# Patient Record
Sex: Female | Born: 1995 | Race: White | Hispanic: No | Marital: Single | Smoking: Never smoker
Health system: Southern US, Community
[De-identification: ages and names within clinical notes are randomized; demographics above are authoritative.]

## PROBLEM LIST (undated history)

## (undated) DIAGNOSIS — D649 Anemia, unspecified: Secondary | ICD-10-CM

## (undated) DIAGNOSIS — I498 Other specified cardiac arrhythmias: Secondary | ICD-10-CM

## (undated) DIAGNOSIS — F4310 Post-traumatic stress disorder, unspecified: Secondary | ICD-10-CM

## (undated) DIAGNOSIS — F419 Anxiety disorder, unspecified: Secondary | ICD-10-CM

## (undated) DIAGNOSIS — Z9889 Other specified postprocedural states: Secondary | ICD-10-CM

## (undated) DIAGNOSIS — K5090 Crohn's disease, unspecified, without complications: Secondary | ICD-10-CM

## (undated) DIAGNOSIS — Z9109 Other allergy status, other than to drugs and biological substances: Secondary | ICD-10-CM

## (undated) DIAGNOSIS — G90A Postural orthostatic tachycardia syndrome (POTS): Secondary | ICD-10-CM

## (undated) DIAGNOSIS — R000 Tachycardia, unspecified: Secondary | ICD-10-CM

## (undated) DIAGNOSIS — F329 Major depressive disorder, single episode, unspecified: Secondary | ICD-10-CM

## (undated) DIAGNOSIS — J45909 Unspecified asthma, uncomplicated: Secondary | ICD-10-CM

## (undated) DIAGNOSIS — N809 Endometriosis, unspecified: Secondary | ICD-10-CM

## (undated) DIAGNOSIS — I951 Orthostatic hypotension: Secondary | ICD-10-CM

## (undated) DIAGNOSIS — R197 Diarrhea, unspecified: Secondary | ICD-10-CM

## (undated) DIAGNOSIS — K219 Gastro-esophageal reflux disease without esophagitis: Secondary | ICD-10-CM

## (undated) DIAGNOSIS — R1110 Vomiting, unspecified: Secondary | ICD-10-CM

## (undated) DIAGNOSIS — J45990 Exercise induced bronchospasm: Secondary | ICD-10-CM

## (undated) DIAGNOSIS — R55 Syncope and collapse: Secondary | ICD-10-CM

## (undated) DIAGNOSIS — Q796 Ehlers-Danlos syndrome, unspecified: Secondary | ICD-10-CM

## (undated) DIAGNOSIS — G939 Disorder of brain, unspecified: Secondary | ICD-10-CM

## (undated) HISTORY — DX: Syncope and collapse: R55

## (undated) HISTORY — DX: Orthostatic hypotension: I95.1

## (undated) HISTORY — DX: Major depressive disorder, single episode, unspecified: F32.9

## (undated) HISTORY — DX: Unspecified asthma, uncomplicated: J45.909

## (undated) HISTORY — PX: INTRAUTERINE DEVICE INSERTION: SHX323

## (undated) HISTORY — DX: Post-traumatic stress disorder, unspecified: F43.10

## (undated) HISTORY — DX: Postural orthostatic tachycardia syndrome (POTS): G90.A

## (undated) HISTORY — DX: Other allergy status, other than to drugs and biological substances: Z91.09

## (undated) HISTORY — DX: Ehlers-Danlos syndrome, unspecified: Q79.60

## (undated) HISTORY — DX: Other specified postprocedural states: Z98.890

## (undated) HISTORY — DX: Other specified cardiac arrhythmias: I49.8

## (undated) HISTORY — DX: Vomiting, unspecified: R11.10

## (undated) HISTORY — DX: Tachycardia, unspecified: R00.0

## (undated) HISTORY — DX: Disorder of brain, unspecified: G93.9

## (undated) HISTORY — DX: Endometriosis, unspecified: N80.9

## (undated) HISTORY — DX: Exercise induced bronchospasm: J45.990

## (undated) HISTORY — PX: WISDOM TOOTH EXTRACTION: SHX21

## (undated) HISTORY — DX: Anemia, unspecified: D64.9

## (undated) HISTORY — DX: Diarrhea, unspecified: R19.7

## (undated) HISTORY — DX: Crohn's disease, unspecified, without complications: K50.90

---
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
## 2014-02-09 ENCOUNTER — Emergency Department: Payer: Self-pay | Admitting: Emergency Medicine

## 2014-02-09 LAB — URINALYSIS, COMPLETE
Bilirubin,UR: NEGATIVE
Blood: NEGATIVE
GLUCOSE, UR: NEGATIVE mg/dL (ref 0–75)
Ketone: NEGATIVE
LEUKOCYTE ESTERASE: NEGATIVE
Nitrite: NEGATIVE
PH: 7 (ref 4.5–8.0)
Protein: NEGATIVE
RBC,UR: NONE SEEN /HPF (ref 0–5)
Specific Gravity: 1.009 (ref 1.003–1.030)
WBC UR: 1 /HPF (ref 0–5)

## 2014-02-09 LAB — COMPREHENSIVE METABOLIC PANEL
ALBUMIN: 2.7 g/dL — AB (ref 3.8–5.6)
ALK PHOS: 92 U/L
Anion Gap: 5 — ABNORMAL LOW (ref 7–16)
BUN: 10 mg/dL (ref 9–21)
Bilirubin,Total: 0.2 mg/dL (ref 0.2–1.0)
CALCIUM: 8.4 mg/dL — AB (ref 9.0–10.7)
CHLORIDE: 109 mmol/L — AB (ref 97–107)
CO2: 24 mmol/L (ref 16–25)
Creatinine: 0.72 mg/dL (ref 0.60–1.30)
EGFR (African American): >60
Glucose: 77 mg/dL (ref 65–99)
Osmolality: 274 (ref 275–301)
POTASSIUM: 4.3 mmol/L (ref 3.3–4.7)
SGOT(AST): 20 U/L (ref 0–26)
SGPT (ALT): 15 U/L (ref 12–78)
Sodium: 138 mmol/L (ref 132–141)
Total Protein: 7.2 g/dL (ref 6.4–8.6)

## 2014-02-09 LAB — CBC
HCT: 35.8 % (ref 35.0–47.0)
HGB: 10.9 g/dL — AB (ref 12.0–16.0)
MCH: 23.1 pg — ABNORMAL LOW (ref 26.0–34.0)
MCHC: 30.4 g/dL — ABNORMAL LOW (ref 32.0–36.0)
MCV: 76 fL — ABNORMAL LOW (ref 80–100)
PLATELETS: 253 10*3/uL (ref 150–440)
RBC: 4.71 10*6/uL (ref 3.80–5.20)
RDW: 18.6 % — ABNORMAL HIGH (ref 11.5–14.5)
WBC: 8.3 10*3/uL (ref 3.6–11.0)

## 2014-02-25 ENCOUNTER — Emergency Department: Payer: Self-pay | Admitting: Emergency Medicine

## 2014-02-25 LAB — COMPREHENSIVE METABOLIC PANEL
AST: 19 U/L (ref 0–26)
Albumin: 2.6 g/dL — ABNORMAL LOW (ref 3.8–5.6)
Alkaline Phosphatase: 70 U/L
Anion Gap: 3 — ABNORMAL LOW (ref 7–16)
BUN: 11 mg/dL (ref 7–18)
Bilirubin,Total: 0.2 mg/dL (ref 0.2–1.0)
CALCIUM: 8.3 mg/dL — AB (ref 9.0–10.7)
CHLORIDE: 110 mmol/L — AB (ref 98–107)
Co2: 28 mmol/L (ref 21–32)
Creatinine: 0.8 mg/dL (ref 0.60–1.30)
EGFR (African American): >60
GLUCOSE: 90 mg/dL (ref 65–99)
Osmolality: 280 (ref 275–301)
Potassium: 3.5 mmol/L (ref 3.5–5.1)
SGPT (ALT): 12 U/L (ref 12–78)
Sodium: 141 mmol/L (ref 136–145)
TOTAL PROTEIN: 6.5 g/dL (ref 6.4–8.6)

## 2014-02-25 LAB — ACETAMINOPHEN LEVEL: ACETAMINOPHEN: 13 ug/mL

## 2014-02-25 LAB — CBC
HCT: 33.2 % — AB (ref 35.0–47.0)
HGB: 10.8 g/dL — AB (ref 12.0–16.0)
MCH: 24.6 pg — AB (ref 26.0–34.0)
MCHC: 32.4 g/dL (ref 32.0–36.0)
MCV: 76 fL — ABNORMAL LOW (ref 80–100)
Platelet: 208 10*3/uL (ref 150–440)
RBC: 4.37 10*6/uL (ref 3.80–5.20)
RDW: 18.2 % — ABNORMAL HIGH (ref 11.5–14.5)
WBC: 7 10*3/uL (ref 3.6–11.0)

## 2014-04-06 ENCOUNTER — Ambulatory Visit: Payer: Self-pay | Admitting: Urgent Care

## 2014-04-06 LAB — COMPREHENSIVE METABOLIC PANEL
ALBUMIN: 2.9 g/dL — AB (ref 3.8–5.6)
ALK PHOS: 90 U/L
ALT: 16 U/L (ref 12–78)
Anion Gap: 3 — ABNORMAL LOW (ref 7–16)
BUN: 8 mg/dL (ref 7–18)
Bilirubin,Total: 0.2 mg/dL (ref 0.2–1.0)
CO2: 30 mmol/L (ref 21–32)
Calcium, Total: 9 mg/dL (ref 9.0–10.7)
Chloride: 107 mmol/L (ref 98–107)
Creatinine: 0.65 mg/dL (ref 0.60–1.30)
EGFR (African American): >60
GLUCOSE: 90 mg/dL (ref 65–99)
Osmolality: 277 (ref 275–301)
POTASSIUM: 3.9 mmol/L (ref 3.5–5.1)
SGOT(AST): 22 U/L (ref 0–26)
SODIUM: 140 mmol/L (ref 136–145)
Total Protein: 7.6 g/dL (ref 6.4–8.6)

## 2014-04-06 LAB — URINALYSIS, COMPLETE
BILIRUBIN, UR: NEGATIVE
Bacteria: NONE SEEN
Blood: NEGATIVE
Glucose,UR: NEGATIVE mg/dL (ref 0–75)
Ketone: NEGATIVE
NITRITE: NEGATIVE
Ph: 5 (ref 4.5–8.0)
Protein: NEGATIVE
RBC, UR: NONE SEEN /HPF (ref 0–5)
SPECIFIC GRAVITY: 1.021 (ref 1.003–1.030)
WBC UR: 1 /HPF (ref 0–5)

## 2014-04-06 LAB — CBC WITH DIFFERENTIAL/PLATELET
BASOS ABS: 0 10*3/uL (ref 0.0–0.1)
Basophil %: 0.6 %
EOS ABS: 0.1 10*3/uL (ref 0.0–0.7)
Eosinophil %: 1.3 %
HCT: 38.8 % (ref 35.0–47.0)
HGB: 12.2 g/dL (ref 12.0–16.0)
LYMPHS ABS: 1.5 10*3/uL (ref 1.0–3.6)
Lymphocyte %: 19.8 %
MCH: 23.6 pg — ABNORMAL LOW (ref 26.0–34.0)
MCHC: 31.3 g/dL — ABNORMAL LOW (ref 32.0–36.0)
MCV: 75 fL — ABNORMAL LOW (ref 80–100)
Monocyte #: 0.7 x10 3/mm (ref 0.2–0.9)
Monocyte %: 9.3 %
NEUTROS PCT: 69 %
Neutrophil #: 5.3 10*3/uL (ref 1.4–6.5)
Platelet: 204 10*3/uL (ref 150–440)
RBC: 5.16 10*6/uL (ref 3.80–5.20)
RDW: 16.9 % — ABNORMAL HIGH (ref 11.5–14.5)
WBC: 7.7 10*3/uL (ref 3.6–11.0)

## 2014-04-06 LAB — PREGNANCY, URINE: PREGNANCY TEST, URINE: NEGATIVE m[IU]/mL

## 2014-04-06 LAB — SEDIMENTATION RATE: ERYTHROCYTE SED RATE: 19 mm/h (ref 0–20)

## 2014-04-15 ENCOUNTER — Other Ambulatory Visit: Payer: Self-pay | Admitting: Urgent Care

## 2014-04-15 LAB — CLOSTRIDIUM DIFFICILE(ARMC)

## 2014-04-17 DIAGNOSIS — Q828 Other specified congenital malformations of skin: Secondary | ICD-10-CM

## 2014-04-17 DIAGNOSIS — Q796 Ehlers-Danlos syndrome, unspecified: Secondary | ICD-10-CM | POA: Insufficient documentation

## 2014-04-17 HISTORY — DX: Other specified congenital malformations of skin: Q82.8

## 2014-04-17 LAB — STOOL CULTURE

## 2014-06-21 DIAGNOSIS — D899 Disorder involving the immune mechanism, unspecified: Secondary | ICD-10-CM | POA: Insufficient documentation

## 2014-09-05 DIAGNOSIS — K529 Noninfective gastroenteritis and colitis, unspecified: Secondary | ICD-10-CM | POA: Insufficient documentation

## 2015-01-29 ENCOUNTER — Ambulatory Visit: Payer: Self-pay | Admitting: Urgent Care

## 2015-05-19 ENCOUNTER — Telehealth: Payer: Self-pay

## 2015-05-19 NOTE — Telephone Encounter (Signed)
That is correct - thanks

## 2015-05-19 NOTE — Telephone Encounter (Signed)
Patient called asking for [place] to fax [phone] her a lab order to her doctors's office in [place] (Dr. Scherry Ran). She stated that she needed to have her Humira refilled.  I faxed a CMP and a CBC which is what you had ordered for her to do. Please let me know if you are okay with this or if you wanted to add something else. Tx./MB

## 2015-06-16 ENCOUNTER — Ambulatory Visit (INDEPENDENT_AMBULATORY_CARE_PROVIDER_SITE_OTHER): Payer: BLUE CROSS/BLUE SHIELD | Admitting: Urgent Care

## 2015-06-16 ENCOUNTER — Encounter: Payer: Self-pay | Admitting: Urgent Care

## 2015-06-16 VITALS — BP 105/68 | HR 78 | Temp 98.4°F | Ht 62.0 in | Wt 136.4 lb

## 2015-06-16 DIAGNOSIS — K5090 Crohn's disease, unspecified, without complications: Secondary | ICD-10-CM

## 2015-06-16 MED ORDER — HUMIRA PEN 40 MG/0.8ML ~~LOC~~ PNKT: 40.0000 mg | PEN_INJECTOR | SUBCUTANEOUS | Status: DC

## 2015-06-16 NOTE — Progress Notes (Signed)
   Primary Care Physician: Dr Andrey Spearman Primary Gastroenterologist:  Dr Servando Snare  Chief Complaint  Patient presents with  . Crohn's Disease    Follow-up    HPI: Penny Ho is a 19 y.o. female here for follow up of Crohn's disease.  She is doing very. Denies heartburn, indigestion, nausea, vomiting, dysphagia, odynophagia or anorexia. Denies constipation, diarrhea, rectal bleeding, melena or weight loss.  She is working in [employer] at [employer] over the summer.  She feels great like Humira has changed her life.  Current Outpatient Prescriptions  Medication Sig Dispense Refill  . dicyclomine (BENTYL) 10 MG capsule Take 10 mg by mouth 3 (three) times daily as needed.     Marland Kitchen HUMIRA PEN 40 MG/0.8ML PNKT Inject 40 mg as directed every 14 (fourteen) days. 2 each 5  . mesalamine (PENTASA) 250 MG CR capsule Take 250 mg by mouth 4 (four) times daily.    . midodrine (PROAMATINE) 10 MG tablet Take 10 mg by mouth 3 (three) times daily.    . promethazine (PHENERGAN) 12.5 MG tablet Take 12.5 mg by mouth every 6 (six) hours as needed for nausea or vomiting.    . sertraline (ZOLOFT) 100 MG tablet Take 100 mg by mouth daily.  5   No current facility-administered medications for this visit.    Allergies as of 06/16/2015  . (No Known Allergies)    Review of Systems: Gen: Denies any fever, chills, fatigue, weakness, malaise ENT: Negative for hoarseness, difficulty swallowing , nasal congestion CV: Denies chest pain, angina, palpitations, syncope, orthopnea, PND, peripheral edema, and claudication. Resp: Denies dyspnea at rest, dyspnea with exercise, cough, sputum, wheezing, coughing up blood, and pleurisy. GI: See HPI GU:  Negative for dysuria, hematuria, urinary incontinence, urinary frequency, nocturnal urination.  Endo: Negative for unusual weight change or sweats Derm: Denies jaundice, rash, itching, or unhealing ulcers.  Psych: Denies depression, anxiety, memory loss, suicidal ideation, hallucinations,  paranoia, and confusion. Heme: Denies bruising, bleeding, and enlarged lymph nodes.   Physical Examination:  BP 105/68 mmHg  Pulse 78  Temp(Src) 98.4 F (36.9 C) (Oral)  Ht 5\' 2"  (1.575 m)  Wt 136 lb 6.4 oz (61.871 kg)  BMI 24.94 kg/m2  LMP 08/15/2013 Body mass index is 24.94 kg/(m^2). Patient's last menstrual period was 08/15/2013. General:   Alert,  Well-developed, well-nourished, pleasant and cooperative in NAD Head:  Normocephalic and atraumatic. Eyes:  Sclera clear, no icterus.   Conjunctiva pink. Mouth:  No deformity or lesions.  Oropharynx pink & moist. Neck:  Supple; no masses or thyromegaly. Heart:  Regular rate and rhythm; no murmurs, clicks, rubs,  or gallops. Abdomen:   Normal bowel sounds.  Soft, nontender and nondistended. No masses, hepatosplenomegaly or hernias noted. No guarding or rebound tenderness.   Msk:  Symmetrical without Deane deformities. Normal posture. Pulses:  Normal pulses noted. Extremities:  Without clubbing or edema. Neurologic:  Alert and  oriented x3;  grossly normal neurologically. Skin:  Intact without significant lesions or rashes. Cervical Nodes:  No significant cervical adenopathy. Psych:  Alert and cooperative. Normal mood and affect.

## 2015-06-16 NOTE — Assessment & Plan Note (Signed)
Doing very well on Humira Continue Humira every 2 weeks Stop PENTASA Labs today Office visit in 3 months

## 2015-06-16 NOTE — Patient Instructions (Signed)
Continue Humira every 2 weeks Stop PENTASA Please get your labs as soon as possible.  We will call you with results.  Crohn Disease Crohn disease is a long-term (chronic) soreness and redness (inflammation) of the intestines (bowel). It can affect any portion of the digestive tract, from the mouth to the anus. It can also cause problems outside the digestive tract. Crohn disease is closely related to a disease called ulcerative colitis (together, these two diseases are called inflammatory bowel disease).  CAUSES  The cause of Crohn disease is not known. One Nelva Bush is that, in an easily affected person, the immune system is triggered to attack the body's own digestive tissue. Crohn disease runs in families. It seems to be more common in certain geographic areas and amongst certain races. There are no clear-cut dietary causes.  SYMPTOMS  Crohn disease can cause many different symptoms since it can affect many different parts of the body. Symptoms include:  Fatigue.  Weight loss.  Chronic diarrhea, sometime bloody.  Abdominal pain and cramps.  Fever.  Ulcers or canker sores in the mouth or rectum.  Anemia (low red blood cells).  Arthritis, skin problems, and eye problems may occur. Complications of Crohn disease can include:  Series of holes (perforation) of the bowel.  Portions of the intestines sticking to each other (adhesions).  Obstruction of the bowel.  Fistula formation, typically in the rectal area but also in other areas. A fistula is an opening between the bowels and the outside, or between the bowels and another organ.  A painful crack in the mucous membrane of the anus (rectal fissure). DIAGNOSIS  Your caregiver may suspect Crohn disease based on your symptoms and an exam. Blood tests may confirm that there is a problem. You may be asked to submit a stool specimen for examination. X-rays and CT scans may be necessary. Ultimately, the diagnosis is usually made after a  procedure that uses a flexible tube that is inserted via your mouth or your anus. This is done under sedation and is called either an upper endoscopy or colonoscopy. With these tests, the specialist can take tiny tissue samples and remove them from the inside of the bowel (biopsy). Examination of this biopsy tissue under a microscope can reveal Crohn disease as the cause of your symptoms. Due to the many different forms that Crohn disease can take, symptoms may be present for several years before a diagnosis is made. TREATMENT  Medications are often used to decrease inflammation and control the immune system. These include medicines related to aspirin, steroid medications, and newer and stronger medications to slow down the immune system. Some medications may be used as suppositories or enemas. A number of other medications are used or have been studied. Your caregiver will make specific recommendations. HOME CARE INSTRUCTIONS   Symptoms such as diarrhea can be controlled with medications. Avoid foods that have a laxative effect such as fresh fruit, vegetables, and dairy products. During flare-ups, you can rest your bowel by refraining from solid foods. Drink clear liquids frequently during the day. (Electrolyte or rehydrating fluids are best. Your caregiver can help you with suggestions.) Drink often to prevent loss of body fluids (dehydration). When diarrhea has cleared, eat small meals and more frequently. Avoid food additives and stimulants such as caffeine (coffee, tea, or chocolate). Enzyme supplements may help if you develop intolerance to a sugar in dairy products (lactose). Ask your caregiver or dietitian about specific dietary instructions.  Try to maintain a positive attitude.  Learn relaxation techniques such as self-hypnosis, mental imaging, and muscle relaxation.  If possible, avoid stresses which can aggravate your condition.  Exercise regularly.  Follow your diet.  Always get plenty of  rest. SEEK MEDICAL CARE IF:   Your symptoms fail to improve after a week or two of new treatment.  You experience continued weight loss.  You have ongoing cramps or loose bowels.  You develop a new skin rash, skin sores, or eye problems. SEEK IMMEDIATE MEDICAL CARE IF:   You have worsening of your symptoms or develop new symptoms.  You have a fever.  You develop bloody diarrhea.  You develop severe abdominal pain. MAKE SURE YOU:   Understand these instructions.  Will watch your condition.  Will get help right away if you are not doing well or get worse. Document Released: 09/13/2005 Document Revised: 04/20/2014 Document Reviewed: 08/12/2007 [facility] Patient Information 2015 [place], [place]. This information is not intended to replace advice given to you by your health care provider. Make sure you discuss any questions you have with your health care provider.

## 2015-06-17 ENCOUNTER — Encounter: Payer: Self-pay | Admitting: Urgent Care

## 2015-06-17 LAB — CBC WITH DIFFERENTIAL/PLATELET
Basophils Absolute: 0.1 10*3/uL (ref 0.0–0.2)
Basos: 1 %
EOS (ABSOLUTE): 0.2 10*3/uL (ref 0.0–0.4)
EOS: 2 %
HEMATOCRIT: 39.5 % (ref 34.0–46.6)
Hemoglobin: 12.3 g/dL (ref 11.1–15.9)
Immature Grans (Abs): 0 10*3/uL (ref 0.0–0.1)
Immature Granulocytes: 0 %
LYMPHS: 33 %
Lymphocytes Absolute: 2.4 10*3/uL (ref 0.7–3.1)
MCH: 25 pg — ABNORMAL LOW (ref 26.6–33.0)
MCHC: 31.1 g/dL — ABNORMAL LOW (ref 31.5–35.7)
MCV: 80 fL (ref 79–97)
MONOCYTES: 9 %
MONOS ABS: 0.6 10*3/uL (ref 0.1–0.9)
Neutrophils Absolute: 4 10*3/uL (ref 1.4–7.0)
Neutrophils: 55 %
Platelets: 246 10*3/uL (ref 150–379)
RBC: 4.92 x10E6/uL (ref 3.77–5.28)
RDW: 17.5 % — ABNORMAL HIGH (ref 12.3–15.4)
WBC: 7.3 10*3/uL (ref 3.4–10.8)

## 2015-06-17 LAB — COMPREHENSIVE METABOLIC PANEL
ALT: 15 IU/L (ref 0–32)
AST: 22 IU/L (ref 0–40)
Albumin/Globulin Ratio: 1.6 (ref 1.1–2.5)
Albumin: 3.8 g/dL (ref 3.5–5.5)
Alkaline Phosphatase: 87 IU/L (ref 39–117)
BUN / CREAT RATIO: 22 — AB (ref 8–20)
BUN: 11 mg/dL (ref 6–20)
CALCIUM: 9.4 mg/dL (ref 8.7–10.2)
CO2: 27 mmol/L (ref 18–29)
CREATININE: 0.51 mg/dL — AB (ref 0.57–1.00)
Chloride: 100 mmol/L (ref 97–108)
GFR calc Af Amer: 160 mL/min/{1.73_m2} (ref >59)
GFR calc non Af Amer: 139 mL/min/{1.73_m2} (ref >59)
Globulin, Total: 2.4 g/dL (ref 1.5–4.5)
Glucose: 76 mg/dL (ref 65–99)
Potassium: 4.5 mmol/L (ref 3.5–5.2)
SODIUM: 139 mmol/L (ref 134–144)
TOTAL PROTEIN: 6.2 g/dL (ref 6.0–8.5)

## 2015-06-17 LAB — C-REACTIVE PROTEIN: CRP: 0.8 mg/L (ref 0.0–4.9)

## 2015-09-09 ENCOUNTER — Other Ambulatory Visit: Payer: Self-pay | Admitting: Family Medicine

## 2015-09-09 DIAGNOSIS — Z975 Presence of (intrauterine) contraceptive device: Secondary | ICD-10-CM

## 2015-09-14 ENCOUNTER — Telehealth: Payer: Self-pay

## 2015-09-14 NOTE — Telephone Encounter (Signed)
Patient left message stating that she is has Crohn's Disease and on Humira and having a relapse of symptoms. Would like to be seen as soon as possible by a provider. Was a previous patient of Lorenza Burton, NP. Last seen in office 06/16/15.

## 2015-09-15 ENCOUNTER — Ambulatory Visit
Admission: RE | Admit: 2015-09-15 | Discharge: 2015-09-15 | Disposition: A | Discharge status: Home / Self Care | Payer: BLUE CROSS/BLUE SHIELD | Source: Ambulatory Visit | Attending: Family Medicine | Admitting: Family Medicine

## 2015-09-15 DIAGNOSIS — Z975 Presence of (intrauterine) contraceptive device: Secondary | ICD-10-CM | POA: Insufficient documentation

## 2015-09-16 NOTE — Telephone Encounter (Signed)
LVM for pt to return my call to schedule a follow up appt.  

## 2015-11-29 ENCOUNTER — Telehealth: Payer: Self-pay | Admitting: Gastroenterology

## 2015-11-29 NOTE — Telephone Encounter (Signed)
Pt has been scheduled in [place] on Wed, 12/01/15.

## 2015-11-29 NOTE — Telephone Encounter (Signed)
Penny Ho, patient needs an appointment with Dr Servando Snare as soon as possible. She is an established patient with Dr Rayford Halsted. 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 Her Crohn's Disease has flared up very bad and she needs an appointment with Dr Servando Snare. Thanks

## 2015-12-01 ENCOUNTER — Encounter: Payer: Self-pay | Admitting: Gastroenterology

## 2015-12-01 ENCOUNTER — Other Ambulatory Visit: Payer: Self-pay

## 2015-12-01 ENCOUNTER — Ambulatory Visit (INDEPENDENT_AMBULATORY_CARE_PROVIDER_SITE_OTHER): Payer: BLUE CROSS/BLUE SHIELD | Admitting: Gastroenterology

## 2015-12-01 VITALS — BP 117/67 | HR 85 | Temp 97.7°F | Ht 62.0 in | Wt 135.0 lb

## 2015-12-01 DIAGNOSIS — D649 Anemia, unspecified: Secondary | ICD-10-CM

## 2015-12-01 DIAGNOSIS — K50919 Crohn's disease, unspecified, with unspecified complications: Secondary | ICD-10-CM

## 2015-12-01 DIAGNOSIS — J45909 Unspecified asthma, uncomplicated: Secondary | ICD-10-CM

## 2015-12-01 DIAGNOSIS — R1110 Vomiting, unspecified: Secondary | ICD-10-CM | POA: Insufficient documentation

## 2015-12-01 DIAGNOSIS — R197 Diarrhea, unspecified: Secondary | ICD-10-CM

## 2015-12-01 DIAGNOSIS — K5090 Crohn's disease, unspecified, without complications: Secondary | ICD-10-CM | POA: Insufficient documentation

## 2015-12-01 HISTORY — DX: Anemia, unspecified: D64.9

## 2015-12-01 HISTORY — DX: Unspecified asthma, uncomplicated: J45.909

## 2015-12-01 NOTE — Progress Notes (Signed)
Primary Care Physician: Noralee Stain, MD  Primary Gastroenterologist:  Dr. Midge Minium  No chief complaint on file.   HPI: Penny Ho is a 19 y.o. female here  With a history of Crohn's disease and a recent flare. The patient states she had eaten some cookies though and reports that she has had problems in the past when she has eating cookie dough. She believes that this may have set off her [place] recent flare of Crohn's. The patient had a C-reactive protein sent off and it was 13. The patient reports that she has abdominal cramps and diarrhea at the present time. The patient has been on Humira without any incident. I was contacted by her primary care provider about this a week ago and now the patient is here for follow-up of Crohn's.  Current Outpatient Prescriptions  Medication Sig Dispense Refill  . dicyclomine (BENTYL) 10 MG capsule Take 10 mg by mouth 3 (three) times daily as needed.     Marland Kitchen HUMIRA PEN 40 MG/0.8ML PNKT Inject 40 mg as directed every 14 (fourteen) days. 2 each 5  . methylphenidate 27 MG PO CR tablet once daily as needed.    . midodrine (PROAMATINE) 10 MG tablet Take 10 mg by mouth 3 (three) times daily.    . promethazine (PHENERGAN) 12.5 MG tablet Take 12.5 mg by mouth every 6 (six) hours as needed for nausea or vomiting.    . sertraline (ZOLOFT) 100 MG tablet Take 100 mg by mouth daily.  5  . Budesonide (UCERIS) 9 MG TB24 Reported on 12/01/2015    . mesalamine (PENTASA) 250 MG CR capsule Take 250 mg by mouth 4 (four) times daily. Reported on 12/01/2015    . propranolol (INDERAL) 10 MG tablet Take by mouth. Reported on 12/01/2015     No current facility-administered medications for this visit.    Allergies as of 12/01/2015 - Review Complete 06/16/2015  Allergen Reaction Noted  . Other Anaphylaxis 12/01/2015    ROS:  General: Negative for anorexia, weight loss, fever, chills, fatigue, weakness. ENT: Negative for hoarseness, difficulty swallowing , nasal  congestion. CV: Negative for chest pain, angina, palpitations, dyspnea on exertion, peripheral edema.  Respiratory: Negative for dyspnea at rest, dyspnea on exertion, cough, sputum, wheezing.  GI: See history of present illness. GU:  Negative for dysuria, hematuria, urinary incontinence, urinary frequency, nocturnal urination.  Endo: Negative for unusual weight change.    Physical Examination:   BP 117/67 mmHg  Pulse 85  Temp(Src) 97.7 F (36.5 C)  Ht 5\' 2"  (1.575 m)  Wt 135 lb (61.236 kg)  BMI 24.69 kg/m2  General: Well-nourished, well-developed in no acute distress.  Eyes: No icterus. Conjunctivae pink. Mouth: Oropharyngeal mucosa moist and pink , no lesions erythema or exudate. Lungs: Clear to auscultation bilaterally. Non-labored. Heart: Regular rate and rhythm, no murmurs rubs or gallops.  Abdomen: Bowel sounds are normal, nontender, nondistended, no hepatosplenomegaly or masses, no abdominal bruits or hernia , no rebound or guarding.   Extremities: No lower extremity edema. No clubbing or deformities. Neuro: Alert and oriented x 3.  Grossly intact. Skin: Warm and dry, no jaundice.   Psych: Alert and cooperative, normal mood and affect.  Labs:    Imaging Studies: No results found.  Assessment and Plan:   Penny Ho is a 18 y.o. y/o female  With a history of Crohn's disease who has been on Humira area the patient is now having a flare of her Crohn's disease with her CRP being  13. The patient has been told that she needs to be started on a rapid taper of steroids for 1 month while we check her Humira antibody levels. If the antibody levels are high then the patient may need to be started on a different biologic. Patient has been explained the plan and agrees with it.   Note: This dictation was prepared with Dragon dictation along with smaller phrase technology. Any transcriptional errors that result from this process are unintentional.

## 2015-12-09 LAB — ADALIMUMAB+AB (SERIAL MONITOR)
ANTI-ADALIMUMAB ANTIBODY: 93 ng/mL
Adalimumab Drug Level: 2.1 ug/mL

## 2015-12-14 ENCOUNTER — Telehealth: Payer: Self-pay | Admitting: Gastroenterology

## 2015-12-14 NOTE — Telephone Encounter (Signed)
Please call patient to talk about some side affects she is having since on steroids.

## 2015-12-15 ENCOUNTER — Telehealth: Payer: Self-pay

## 2015-12-15 NOTE — Telephone Encounter (Signed)
-----   Message from Darren Wohl, MD sent at 12/15/2015  8:59 AM EST ----- Patient know that the blood test did not suggest that she is resistant to her medication. She should continue taking it. 

## 2015-12-15 NOTE — Telephone Encounter (Signed)
Called pt and she stated the prednisone is making her depression really bad. She doesn't feel she can continue with 4 more weeks of this medication. Per Dr. Servando Snare to safely come off the prednisone pt is to do Prednisone 20mg  for 3 days and then 10mg  x 1 week. Pt understood instructions and will let me know if there is a problem.

## 2015-12-15 NOTE — Telephone Encounter (Signed)
-----   Message from Midge Minium, MD sent at 12/15/2015  8:59 AM EST ----- Patient know that the blood test did not suggest that she is resistant to her medication. She should continue taking it.

## 2015-12-15 NOTE — Telephone Encounter (Signed)
ERROR

## 2015-12-15 NOTE — Telephone Encounter (Signed)
Pt notified of results

## 2015-12-15 NOTE — Telephone Encounter (Signed)
LVM for pt to return my call.

## 2016-01-05 ENCOUNTER — Other Ambulatory Visit: Payer: Self-pay

## 2016-01-05 DIAGNOSIS — K50919 Crohn's disease, unspecified, with unspecified complications: Secondary | ICD-10-CM

## 2016-01-05 MED ORDER — HUMIRA PEN 40 MG/0.8ML ~~LOC~~ PNKT: 40.0000 mg | PEN_INJECTOR | SUBCUTANEOUS | Status: DC

## 2016-06-21 ENCOUNTER — Other Ambulatory Visit: Payer: Self-pay

## 2016-06-21 DIAGNOSIS — K50919 Crohn's disease, unspecified, with unspecified complications: Secondary | ICD-10-CM

## 2016-06-21 MED ORDER — HUMIRA PEN 40 MG/0.8ML ~~LOC~~ PNKT: 40.0000 mg | PEN_INJECTOR | SUBCUTANEOUS | Status: DC

## 2016-09-18 ENCOUNTER — Ambulatory Visit (INDEPENDENT_AMBULATORY_CARE_PROVIDER_SITE_OTHER): Payer: PRIVATE HEALTH INSURANCE | Admitting: Gastroenterology

## 2016-09-18 ENCOUNTER — Encounter: Payer: Self-pay | Admitting: Gastroenterology

## 2016-09-18 VITALS — BP 93/55 | HR 100 | Temp 98.7°F | Ht 62.0 in | Wt 134.0 lb

## 2016-09-18 DIAGNOSIS — K6289 Other specified diseases of anus and rectum: Secondary | ICD-10-CM

## 2016-09-18 NOTE — Progress Notes (Signed)
   Primary Care Physician: Noralee Stain, MD  Primary Gastroenterologist:  Dr. Midge Minium  Chief Complaint  Patient presents with  . Crohn's Disease  . Hemorrhoids    HPI: Penny Ho is a 20 y.o. female here for rectal pain. The patient was given a cream for her rectum last week and states it is not helping her.  Current Outpatient Prescriptions  Medication Sig Dispense Refill  . HUMIRA PEN 40 MG/0.8ML PNKT Inject 40 mg as directed every 14 (fourteen) days. 2 each 11  . sertraline (ZOLOFT) 100 MG tablet Take 100 mg by mouth daily.  5  . topiramate (TOPAMAX) 100 MG tablet Take 100 mg by mouth daily.    . Budesonide (UCERIS) 9 MG TB24 Reported on 12/01/2015    . dicyclomine (BENTYL) 10 MG capsule Take 10 mg by mouth 3 (three) times daily as needed.     . mesalamine (PENTASA) 250 MG CR capsule Take 250 mg by mouth 4 (four) times daily. Reported on 12/01/2015    . methylphenidate 27 MG PO CR tablet once daily as needed.    . midodrine (PROAMATINE) 10 MG tablet Take 10 mg by mouth 3 (three) times daily.    . promethazine (PHENERGAN) 12.5 MG tablet Take 12.5 mg by mouth every 6 (six) hours as needed for nausea or vomiting.    . propranolol (INDERAL) 10 MG tablet Take by mouth. Reported on 12/01/2015     No current facility-administered medications for this visit.     Allergies as of 09/18/2016 - Review Complete 06/16/2015  Allergen Reaction Noted  . Other Anaphylaxis 12/01/2015    ROS:  General: Negative for anorexia, weight loss, fever, chills, fatigue, weakness. ENT: Negative for hoarseness, difficulty swallowing , nasal congestion. CV: Negative for chest pain, angina, palpitations, dyspnea on exertion, peripheral edema.  Respiratory: Negative for dyspnea at rest, dyspnea on exertion, cough, sputum, wheezing.  GI: See history of present illness. GU:  Negative for dysuria, hematuria, urinary incontinence, urinary frequency, nocturnal urination.  Endo: Negative for  unusual weight change.    Physical Examination:   BP (!) 93/55   Pulse 100   Temp 98.7 F (37.1 C) (Oral)   Ht 5\' 2"  (1.575 m)   Wt 134 lb (60.8 kg)   BMI 24.51 kg/m   General: Well-nourished, well-developed in no acute distress.  Eyes: No icterus. Conjunctivae pink. Neuro: Alert and oriented x 3.  Grossly intact. Skin: Warm and dry, no jaundice.   Psych: Alert and cooperative, normal mood and affect. Rectal: Tissue breakdown around the anus Labs:    Imaging Studies: No results found.  Assessment and Plan:   Penny Ho is a 20 y.o. y/o female who comes in today with a history of Crohn's disease who is now having anal pain. The patient had a rectal examination showed tissue breakdown around the anus likely from constant moisture and stool to the area. The patient has been told to clean her self with nonalcoholic baby wipes. She has also been told to use acid desitin cream to the area and keep it clean and dry. She will call us in approximate one week if her symptoms do not improve. This is unlikely related to her Crohn's disease.   Note: This dictation was prepared with Dragon dictation along with smaller phrase technology. Any transcriptional errors that result from this process are unintentional.

## 2016-09-19 ENCOUNTER — Telehealth: Payer: Self-pay | Admitting: Surgery

## 2016-09-19 NOTE — Telephone Encounter (Signed)
Patient left a voice message that she forgot her school note for her appointment on 10/2 with Dr. Excell Seltzer. She will need it by Wednesday. Can you e-mail it to her? If not, please call her.

## 2016-09-19 NOTE — Telephone Encounter (Signed)
Faxed pt's school note to [facility] per her request. Sent to the attn: of Dr. Mikael Spray @ [phone].

## 2016-09-19 NOTE — Telephone Encounter (Signed)
Tried contacting pt again this afternoon. Mailbox is full. Not able to leave a message.

## 2016-09-19 NOTE — Telephone Encounter (Signed)
Tried contacting pt but vm was full. Couldn't leave message.

## 2016-09-27 ENCOUNTER — Encounter: Payer: BLUE CROSS/BLUE SHIELD | Admitting: Obstetrics and Gynecology

## 2016-10-04 ENCOUNTER — Encounter: Payer: Self-pay | Admitting: Obstetrics and Gynecology

## 2016-10-04 ENCOUNTER — Ambulatory Visit (INDEPENDENT_AMBULATORY_CARE_PROVIDER_SITE_OTHER): Payer: PRIVATE HEALTH INSURANCE | Admitting: Obstetrics and Gynecology

## 2016-10-04 VITALS — BP 99/63 | HR 79 | Ht 62.0 in | Wt 134.2 lb

## 2016-10-04 DIAGNOSIS — A630 Anogenital (venereal) warts: Secondary | ICD-10-CM | POA: Diagnosis not present

## 2016-10-04 DIAGNOSIS — K50919 Crohn's disease, unspecified, with unspecified complications: Secondary | ICD-10-CM

## 2016-10-04 DIAGNOSIS — N762 Acute vulvitis: Secondary | ICD-10-CM | POA: Diagnosis not present

## 2016-10-04 MED ORDER — NYSTATIN-TRIAMCINOLONE 100000-0.1 UNIT/GM-% EX OINT: 1.0000 "application " | TOPICAL_OINTMENT | Freq: Two times a day (BID) | CUTANEOUS | 0 refills | Status: AC

## 2016-10-04 NOTE — Progress Notes (Signed)
GYN ENCOUNTER NOTE  Subjective:       Penny Ho is a 20 y.o. G0P0000 female is here for gynecologic evaluation of the following issues:  1. Vulvitis- Pt noticed itching in the vaginal area 2 days ago, after applying Desitin cream for anal irritation vulva became red, inflamed and extremely itchy. Symptoms have subsided but not resolved. Pt [facility] where they tested for yeast and BV. Results inconclusive, empirically treated with Metronidazole insertion, pt has completed one day of treatment.  Sexually active with one partner for the past 1.5 years and 3 total partners in the past 2 years. Never tested for STDs and is interested in being tested today.  Recent use of antibiotics in the past 2 weeks.   Gynecologic History No LMP recorded. Patient is not currently having periods (Reason: IUD). Contraception: IUD Last Pap: Never Last mammogram: N/A  Obstetric History OB History  Gravida Para Term Preterm AB Living  0 0 0 0 0 0  SAB TAB Ectopic Multiple Live Births  0 0 0 0 0        Past Medical History:  Diagnosis Date  . Anemia   . Asthma   . Crohn's disease (HCC)   . Vaso vagal episode    P[facility]S-[facility]  . Vomiting and diarrhea     Past Surgical History:  Procedure Laterality Date  . INTRAUTERINE DEVICE INSERTION      Current Outpatient Prescriptions on File Prior to Visit  Medication Sig Dispense Refill  . HUMIRA PEN 40 MG/0.8ML PNKT Inject 40 mg as directed every 14 (fourteen) days. 2 each 11  . sertraline (ZOLOFT) 100 MG tablet Take 100 mg by mouth daily.  5  . topiramate (TOPAMAX) 100 MG tablet Take 100 mg by mouth daily.     No current facility-administered medications on file prior to visit.     Allergies  Allergen Reactions  . Other Anaphylaxis    Tree Nuts- anaphylaxis    Social History   Social History  . Marital status: Single    Spouse name: N/A  . Number of children: N/A  . Years of education: N/A    Occupational History  . Not on file.   Social History Main Topics  . Smoking status: Never Smoker  . Smokeless tobacco: Never Used  . Alcohol use 0.0 oz/week     Comment: occasional  . Drug use: No     Comment: Marijuana occ-last June  . Sexual activity: Not Currently    Partners: Male    Birth control/ protection: IUD   Other Topics Concern  . Not on file   Social History Narrative  . No narrative on file    Family History  Problem Relation Age of Onset  . Cancer Paternal Grandmother   . Cancer Paternal Grandfather   . Colon cancer Neg Hx   . Crohn's disease Neg Hx     The following portions of the patient's history were reviewed and updated as appropriate: allergies, current medications, past family history, past medical history, past social history, past surgical history and problem list.  Review of Systems Review of Systems - General ROS: negative for - chills, fatigue, fever, hot flashes, malaise or night sweats Hematological and Lymphatic ROS: negative for - bleeding problems or swollen lymph nodes Gastrointestinal ROS: negative for - abdominal pain, blood in stools, change in bowel habits and nausea/vomiting Musculoskeletal ROS: negative for - joint pain, muscle pain or muscular weakness  Genito-Urinary ROS: negative for - change in menstrual cycle, dysmenorrhea, dyspareunia, dysuria, genital discharge, genital ulcers, hematuria, incontinence, irregular/heavy menses, nocturia or pelvic pain  Objective:   BP 99/63   Pulse 79   Ht 5\' 2"  (1.575 m)   Wt 134 lb 3.2 oz (60.9 kg)   BMI 24.55 kg/m  CONSTITUTIONAL: Well-developed, well-nourished female in no acute distress.  HENT:  Normocephalic, atraumatic.  NECK: Normal range of motion, supple, no masses.  Normal thyroid.  SKIN: Skin is warm and dry. No rash noted. Not diaphoretic. No erythema. No pallor. NEUROLGIC: Alert and oriented to person, place, and time. PSYCHIATRIC: Normal mood and affect. Normal  behavior. Normal judgment and thought content. CARDIOVASCULAR:Not Examined RESPIRATORY: Not Examined BREASTS: Not Examined ABDOMEN: Soft, non distended; Non tender.  No Organomegaly. PELVIC:  External Genitalia: Moderate to severe vulvar hyperemia located in the perianal posterior fourchette region; possible flat wart at 10:00 in relation to anus  BUS: Normal  Vagina: Normal; white secretions in vault (patient used [employer] yesterday)  Cervix: Normal; no Kangas lesions; IUD string not visualized  Uterus: Normal size, shape,consistency, mobile, nontender  Adnexa: Normal  RV: Normal   Bladder: Nontender MUSCULOSKELETAL: Normal range of motion. No tenderness.  No cyanosis, clubbing, or edema.  PROCEDURE: Wet prep Normal saline-negative KOH-negative     Assessment:   1. Acute vulvitis - NuSwab Vaginitis Plus (VG+)  2. Desires STI testing     Plan:   1. Wet prep as noted 2. Nu swab plus 3. STI testing 4. Nystatin/triamcinolone cream topically twice a day for 14 days 5. Return in 2 weeks for follow-up, Skyla IUD replacement  Corena Herter, PA-S Herold Harms, MD   I have seen, interviewed, and examined the patient in conjunction with the [facility] student and affirm the diagnosis and management plan. Penny Kettles A. Wilbur Oakland, MD, FACOG   Note: This dictation was prepared with Dragon dictation along with smaller phrase technology. Any transcriptional errors that result from this process are unintentional.

## 2016-10-04 NOTE — Patient Instructions (Addendum)
1. STI testing is done today 2. Nu swab is done to assess vaginitis etiology 3. Nystatin triamcinolone cream is to be applied topically to the vulva twice a day for 14 days 4. Return in 2 weeks for follow-up and SkylaIUD insertion

## 2016-10-06 LAB — HIV ANTIBODY (ROUTINE TESTING W REFLEX): HIV SCREEN 4TH GENERATION: NONREACTIVE

## 2016-10-06 LAB — HSV(HERPES SIMPLEX VRS) I + II AB-IGG: HSV 2 Glycoprotein G Ab, IgG: <0.91 index (ref 0.00–0.90)

## 2016-10-06 LAB — HEPATITIS B SURFACE ANTIGEN: HEP B S AG: NEGATIVE

## 2016-10-06 LAB — RPR: RPR: NONREACTIVE

## 2016-10-06 LAB — HEPATITIS C ANTIBODY: Hep C Virus Ab: <0.1 s/co ratio (ref 0.0–0.9)

## 2016-10-09 LAB — NUSWAB VAGINITIS PLUS (VG+)
ATOPOBIUM VAGINAE: HIGH {score} — AB
BVAB 2: HIGH Score — AB
CANDIDA ALBICANS, NAA: NEGATIVE
CANDIDA GLABRATA, NAA: NEGATIVE
CHLAMYDIA TRACHOMATIS, NAA: NEGATIVE
Megasphaera 1: HIGH Score — AB
NEISSERIA GONORRHOEAE, NAA: NEGATIVE
TRICH VAG BY NAA: NEGATIVE

## 2016-10-11 ENCOUNTER — Telehealth: Payer: Self-pay

## 2016-10-11 MED ORDER — METRONIDAZOLE 0.75 % VA GEL: 1.0000 | Freq: Every day | VAGINAL | 0 refills | Status: DC

## 2016-10-11 MED ORDER — METRONIDAZOLE 0.75 % VA GEL: 1.0000 | Freq: Two times a day (BID) | VAGINAL | 0 refills | Status: DC

## 2016-10-11 NOTE — Telephone Encounter (Signed)
-----   Message from Herold Harms, MD sent at 10/06/2016  2:36 PM EDT ----- Please Notify - Labs normal

## 2016-10-11 NOTE — Telephone Encounter (Signed)
Pt aware results. Still having sx. Per mad ok to refill metrogel.

## 2016-10-24 ENCOUNTER — Telehealth: Payer: Self-pay

## 2016-10-24 NOTE — Telephone Encounter (Signed)
Patient called in stating that she is having a "bad crohn's flare." The last dose of Humira taken was on 10/20/16 per patient and this is when she began experiencing Nausea/Vomiting, Abdominal Pain, and Diarrhea.  Spoke with Ginger in regards to this patient. Patient placed on schedule for tomorrow to see Dr. Tobi Bastos for active flare.

## 2016-10-25 ENCOUNTER — Ambulatory Visit (INDEPENDENT_AMBULATORY_CARE_PROVIDER_SITE_OTHER): Payer: PRIVATE HEALTH INSURANCE | Admitting: Gastroenterology

## 2016-10-25 ENCOUNTER — Other Ambulatory Visit
Admission: RE | Admit: 2016-10-25 | Discharge: 2016-10-25 | Disposition: A | Discharge status: Home / Self Care | Payer: 59 | Source: Ambulatory Visit | Attending: Gastroenterology | Admitting: Gastroenterology

## 2016-10-25 ENCOUNTER — Encounter: Payer: Self-pay | Admitting: Gastroenterology

## 2016-10-25 ENCOUNTER — Other Ambulatory Visit: Payer: Self-pay

## 2016-10-25 VITALS — BP 115/67 | HR 106 | Temp 97.7°F | Ht 62.0 in | Wt 136.0 lb

## 2016-10-25 DIAGNOSIS — K5090 Crohn's disease, unspecified, without complications: Secondary | ICD-10-CM | POA: Insufficient documentation

## 2016-10-25 DIAGNOSIS — K50919 Crohn's disease, unspecified, with unspecified complications: Secondary | ICD-10-CM

## 2016-10-25 DIAGNOSIS — K5000 Crohn's disease of small intestine without complications: Secondary | ICD-10-CM

## 2016-10-25 NOTE — Progress Notes (Signed)
Gastroenterology Consultation  Referring Provider:     Noralee Stain, MD Primary Care Physician:  Noralee Stain, MD Primary Gastroenterologist:  Dr. Servando Snare        HPI:   Penny Ho is a 20 y.o. y/o female .  Penny Ho is a patient of Dr Servando Snare whom he has been treating for Crohns disease.Ct abdomen in 2015 demonstrated thickening of the small bowel in the distal and terminal ileum . She was last seen by Dr Servando Snare on 09/18/16 for rectal pain    Diagnosis and date:  Dec 2014, diagnosed here , unsure area affected.  Prior surgeries: None  Present medication:  Humira, every other week since 02/2014 , last flare she states presently , prior to this says been getting worse over the past few months. She had adequate drug levels in 2015 and low antibody titre. Never been on Imuran or methotrexate  Last dose of Humira was 7 days back   Prior biologic use : as above  Last MRE:   > 1 year back none on our system  Last EGD/Colonoscopy: Last colonoscopy 11/2013- ?ileal disease and sigmoid disease- commenced on pentasa and entocort Smoking status: never  Use of NSAID's :  Alleve-as needed , using every other day last 3-4 days  Last use of steroids: 11/2015 -prednisone for 2 weeks, symptoms did not really get better Chronic steroid use : no  Last DEXA scan : no  Hep B/A: immunization status:  Unknown but has had immunization  Last Tdap, pneumonia and flu shot: Unsure about tdap but thinks she has had recently .  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  Site :rt lower abdomen and epigastrium  Radiation: over her abdomen  Severity :moderate    Nature of pain: crampy Aggravating factors: eating  Relieving factors : waiting  Weight loss: no  NSAID use: yes  PPI use :none  Gall bladder surgery: yes  Frequency of bowel movements: once every few hours, atleast 20 per day , looks like grains of rice , blood seen .  Change in bowel movements: yes-since 10/2015 when she had salmonella  Relief with bowel movements: no  Gas/Bloating/Abdominal distension: yes, has abdominal distension , when she passes gas is foul smelling , consumes a bit, no diet sodas, no gum, no crystalite, does consume high fructose corn syrup.    Past Medical History:  Diagnosis Date  . Anemia   . Asthma   . Crohn's disease (HCC)   . Vaso vagal episode    POTS-followed by [facility]  . Vomiting and diarrhea     Past Surgical History:  Procedure Laterality Date  . INTRAUTERINE DEVICE INSERTION      Prior to Admission medications   Medication Sig Start Date End Date Taking? Authorizing Provider  CIPRODEX otic suspension APPLY FOUR DROPS TO AFFECTED EAR TWICE DAILY FOR 7 DAYS 10/07/16  Yes Historical Provider, MD  gentamicin ointment (GARAMYCIN) 0.1 % APPLY TO PAINFUL EAR THREE TIMES DAILY 10/06/16  Yes Historical Provider, MD  HUMIRA PEN 40 MG/0.8ML PNKT Inject 40 mg as directed every  14 (fourteen) days. 06/21/16  Yes Darren Servando SnareWohl, MD  hydrocortisone-pramoxine [facility](ANALPRAM-HC) 2.5-1 % rectal cream APPLY A SMALL AMOUNT TO AFFECTED AREA TWICE A DAY 09/04/16  Yes Historical Provider, MD  olopatadine (PATANOL) 0.1 % ophthalmic solution APPLY 1 DROP INTO BOTH EYES TWICE A DAY 09/21/16  Yes Historical Provider, MD  sertraline (ZOLOFT) 100 MG tablet Take 100 mg by mouth daily. 05/27/15  Yes Historical Provider, MD  topiramate (TOPAMAX) 50 MG tablet 1 TABLET BY MOUTH TWICE A DAY 09/02/16  Yes Historical Provider, MD    Family History  Problem Relation Age of Onset  . Cancer Paternal Grandmother   . Cancer Paternal Grandfather   . Colon cancer Neg Hx   . Crohn's  disease Neg Hx      Social History  Substance Use Topics  . Smoking status: Never Smoker  . Smokeless tobacco: Never Used  . Alcohol use 0.0 oz/week     Comment: occasional    Allergies as of 10/25/2016 - Review Complete 10/25/2016  Allergen Reaction Noted  . Other Anaphylaxis 12/01/2015    Review of Systems:    All systems reviewed and negative except where noted in HPI.   Physical Exam:  BP 115/67   Pulse (!) 106   Temp 97.7 F (36.5 C) (Oral)   Ht 5\' 2"  (1.575 m)   Wt 136 lb (61.7 kg)   BMI 24.87 kg/m  No LMP recorded. Patient is not currently having periods (Reason: IUD). Psych:  Alert and cooperative. Normal mood and affect. General:   Alert,  Well-developed, well-nourished, pleasant and cooperative in NAD Head:  Normocephalic and atraumatic. Eyes:  Sclera clear, no icterus.   Conjunctiva pink. Ears:  Normal auditory acuity. Nose:  No deformity, discharge, or lesions. Mouth:  No deformity or lesions,oropharynx pink & moist. Neck:  Supple; no masses or thyromegaly. Lungs:  Respirations even and unlabored.  Clear throughout to auscultation.   No wheezes, crackles, or rhonchi. No acute distress. Heart:  Regular rate and rhythm; no murmurs, clicks, rubs, or gallops. Abdomen:  Normal bowel sounds.  No bruits.  Soft, non-tender and non-distended without masses, hepatosplenomegaly or hernias noted.  No guarding or rebound tenderness.    Msk:  Symmetrical without Obryant deformities. Good, equal movement & strength bilaterally. Pulses:  Normal pulses noted. Extremities:  No clubbing or edema.  No cyanosis. Neurologic:  Alert and oriented x3;  grossly normal neurologically. Skin:  Intact without significant lesions or rashes. No jaundice. Lymph Nodes:  No significant cervical adenopathy. Psych:  Alert and cooperative. Normal mood and affect.  Imaging Studies: No results found.  Assessment and Plan:   Penny Ho is a 20 y.o. y/o female is here for a sick visit. She  has a history of  crohns disease of TI and sigmoid colon , no strictures or fistulae diagnosed in 2015 and been on Humira. History suggests she is losing response. Will check drug and antibody level, cbcd,bmp,LFT,CRP, rule out infection in stools . Get MRE to determine disease activity. At next visit will decide if we can salvage Humira with escalation of dose or change class to Stelara.   1. Check CBCD ,BMP,LFT,CRP,celiac disease panel 2. MR enterogram to determine disease activity  3. Stool tests to rule out infection  4. Vitamin D levels, Hep A/B immune status 5. Stop all NSAID's today .  6. She will find out if she has had a flu shot and if not will provide one next visit 7. [facility]neumonia vaccination [facility].   Follow up in 7-10 days after MRE  Dr [name] MD

## 2016-10-26 ENCOUNTER — Ambulatory Visit (INDEPENDENT_AMBULATORY_CARE_PROVIDER_SITE_OTHER): Payer: PRIVATE HEALTH INSURANCE | Admitting: Obstetrics and Gynecology

## 2016-10-26 ENCOUNTER — Encounter: Payer: Self-pay | Admitting: Obstetrics and Gynecology

## 2016-10-26 VITALS — BP 90/58 | HR 105 | Wt 133.3 lb

## 2016-10-26 DIAGNOSIS — Z30431 Encounter for routine checking of intrauterine contraceptive device: Secondary | ICD-10-CM | POA: Diagnosis not present

## 2016-10-26 DIAGNOSIS — R102 Pelvic and perineal pain: Secondary | ICD-10-CM | POA: Diagnosis not present

## 2016-10-26 DIAGNOSIS — K50919 Crohn's disease, unspecified, with unspecified complications: Secondary | ICD-10-CM

## 2016-10-26 DIAGNOSIS — N762 Acute vulvitis: Secondary | ICD-10-CM | POA: Diagnosis not present

## 2016-10-26 DIAGNOSIS — N941 Unspecified dyspareunia: Secondary | ICD-10-CM

## 2016-10-26 DIAGNOSIS — Z79899 Other long term (current) drug therapy: Secondary | ICD-10-CM | POA: Diagnosis not present

## 2016-10-26 LAB — POCT URINE PREGNANCY: PREG TEST UR: NEGATIVE

## 2016-10-26 MED ORDER — LEVONORGESTREL 13.5 MG IU IUD: 1.0000 | INTRAUTERINE_SYSTEM | Freq: Once | INTRAUTERINE | 0 refills | Status: DC

## 2016-10-26 NOTE — Progress Notes (Signed)
Chief complaint: 1. Chronic vulvitis 2. Encounter for IUD management 3. Chronic pelvic pain 4. History of Crohn's disease  Patient presents for follow-up.  Chronic vulvitis: Patient has not had any improvement in symptomatology with utilizing metronidazole as well as nystatin/triamcinolone cream topically to the vulva. She continues to have significant itching and burning and pain.  Encounter for IUD management: Patient was to have Skyla removed and new Skyla reinserted. However, upon further history taking, it appears that the patient may have endometriosis. If so, a better management plan would be to confirm the diagnosis and then insert a Mirena for better treatment of this condition. She is willing to proceed with laparoscopy with biopsies and prefers to have a Mirena inserted at the time of this.  Chronic pelvic pain: Patient continues to have significant pelvic pain in the form of low back pain with radiation into the buttocks and thighs during menses (when they occur). She continues to experience deep thrusting dyspareunia to a point where she does not wish to engage in relations.  Past medical history, past surgical history, problem list, medications, and allergies are reviewed.  OBJECTIVE: BP (!) 90/58   Pulse (!) 105   Wt 133 lb 4.8 oz (60.5 kg)   BMI 24.38 kg/m  Pleasant anxious female in no acute distress Abdomen: Soft, nontender without peritoneal signs  PELVIC:             External Genitalia: Moderate to severe vulvar hyperemia located in the perianal posterior fourchette region; possible flat wart at 10:00 in relation to anus             BUS: Normal             Vagina: Normal; white secretions in vault              Cervix: Normal; no Boughner lesions; IUD string not visualized             Uterus: Not examined today             Adnexa: Not examined today             RV: Severe hyperemia and thickening of perianal tissue with possible leukoplakia hasn't just to the left of  midline at the posterior fourchette  PROCEDURE: Vulvar biopsy Verbal consent is obtained. Patient was placed in the dorsal lithotomy position. The perineum is painted with Betadine solution. Patient became very anxious and nervous during the cleansing process and probably decided to abort the biopsy. She was extremely anxious and unable to tolerate the thought of a lidocaine injection. Procedure was discontinued.  ASSESSMENT: 1. 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 Vaginal discharge, chronic 3. Severe dysmenorrhea and deep thrusting dyspareunia; history suspicious for endometriosis 4. Contraception management needed; Christean Grief IUD was to be removed and reinserted; however, because of the patient's potential for having endometriosis, this was deferred until surgery. Mirena will be inserted at the time of laparoscopy  PLAN: 1. Vulvar biopsy-discontinued due to patient intolerance 2. Schedule laparoscopy with peritoneal biopsies to rule out endometriosis. Return in 1 week before surgery for preoperative appointment 3. Remove Skyla at the time of laparoscopy; replace with Mirena IUD 4. Vulvar biopsy will be performed at the time of laparoscopy to rule out vulvar dystrophy/lichen sclerosus 5. New swab was taken today  A total of 25 minutes were spent face-to-face with the patient during this encounter and over half of that time involved counseling and coordination of care.  Daphine Deutscher  A Penny Halpin, MD  Note: This dictation was prepared with Dragon dictation along with smaller phrase technology. Any transcriptional errors that result from this process are unintentional.

## 2016-10-26 NOTE — Patient Instructions (Signed)
1. Vulvar biopsy was discontinued today 2. Wet prep was performed and did not reveal any significant vaginitis 3. New swab testing was completed today 4. Laparoscopy with peritoneal biopsies will be scheduled-first available. The IUD will be removed and Mirena IUD will be reinserted at the time of the laparoscopy. Vulvar biopsy will also be performed at the time of the laparoscopy 5. Return week before surgery for preoperative appointment  Diagnostic Laparoscopy A diagnostic laparoscopy is a procedure to diagnose diseases in the abdomen. During the procedure, a thin, lighted, pencil-sized instrument called a laparoscope is inserted into the abdomen through an incision. The laparoscope allows your health care provider to look at the organs inside your body. LET [facility] CARE PROVIDER KNOW ABOUT:  Any allergies you have.  All medicines you are taking, including vitamins, herbs, eye drops, creams, and over-the-counter medicines.  Previous problems you or members of your family have had with the use of anesthetics.  Any blood disorders you have.  Previous surgeries you have had.  Medical conditions you have. RISKS AND COMPLICATIONS  Generally, this is a safe procedure. However, problems can occur, which may include:  Infection.  Bleeding.  Damage to other organs.  Allergic reaction to the anesthetics used during the procedure. BEFORE THE PROCEDURE  Do not eat or drink anything after midnight on the night before the procedure or as directed by your health care provider.  Ask your health care provider about:  Changing or stopping your regular medicines.  Taking medicines such as aspirin and ibuprofen. These medicines can thin your blood. Do not take these medicines before your procedure if your health care provider instructs you not to.  Plan to have someone take you home after the procedure. PROCEDURE  You may be given a medicine to help you relax (sedative).  You will be  given a medicine to make you sleep (general anesthetic).  Your abdomen will be inflated with a gas. This will make your organs easier to see.  Small incisions will be made in your abdomen.  A laparoscope and other small instruments will be inserted into the abdomen through the incisions.  A tissue sample may be removed from an organ in the abdomen for examination.  The instruments will be removed from the abdomen.  The gas will be released.  The incisions will be closed with stitches (sutures). AFTER THE PROCEDURE  Your blood pressure, heart rate, breathing rate, and blood oxygen level will be monitored often until the medicines you were given have worn off.   This information is not intended to replace advice given to you by your health care provider. Make sure you discuss any questions you have with your health care provider.   Document Released: 03/12/2001 Document Revised: 08/25/2015 Document Reviewed: 07/17/2014 Elsevier Interactive Patient Education [employer]    Endometriosis Endometriosis is a condition in which the tissue that lines the uterus (endometrium) grows outside of its normal location. The tissue may grow in many locations close to the uterus, but it commonly grows on the ovaries, fallopian tubes, vagina, or bowel. Because the uterus expels, or sheds, its lining every menstrual cycle, there is bleeding wherever the endometrial tissue is located. This can cause pain because blood is irritating to tissues not normally exposed to it.  CAUSES  The cause of endometriosis is not known.  SIGNS AND SYMPTOMS  Often, there are no symptoms. When symptoms are present, they can vary with the location of the displaced tissue. Various symptoms  can occur at different times. Although symptoms occur mainly during a woman's menstrual period, they can also occur midcycle and usually stop with menopause. Some people may go months with no symptoms at all. Symptoms may include:    Back or abdominal pain.   Heavier bleeding during periods.   Pain during intercourse.   Painful bowel movements.   Infertility. DIAGNOSIS  Your health care provider will do a physical exam and ask about your symptoms. Various tests may be done, such as:   Blood tests and urine tests. These are done to help rule out other problems.   Ultrasound. This test is done to look for abnormal tissue.   An X-ray of the lower bowel (barium enema).  Laparoscopy. In this procedure, a thin, lighted tube with a tiny camera on the end (laparoscope) is inserted into your abdomen. This helps your health care provider look for abnormal tissue to confirm the diagnosis. The health care provider may also remove a small piece of tissue (biopsy) from any abnormal tissue found. This tissue sample can then be sent to a lab so it can be looked at under a microscope. TREATMENT  Treatment will vary and may include:   Medicines to relieve pain. Nonsteroidal anti-inflammatory drugs (NSAIDs) are a type of pain medicine that can help to relieve the pain caused by endometriosis.  Hormonal therapy. When using hormonal therapy, periods are eliminated. This eliminates the monthly exposure to blood by the displaced endometrial tissue.   Surgery. Surgery may sometimes be done to remove the abnormal endometrial tissue. In severe cases, surgery may be done to remove the fallopian tubes, uterus, and ovaries (hysterectomy). HOME CARE INSTRUCTIONS   Take all medicines as directed by your health care provider. Do not take aspirin because it may increase bleeding when you are not on hormonal therapy.   Avoid activities that produce pain, including sexual activity. SEEK MEDICAL CARE IF:  You have pelvic pain before, after, or during your periods.  You have pelvic pain between periods that gets worse during your period.  You have pelvic pain during or after sex.  You have pelvic pain with bowel movements or  urination, especially during your period.  You have problems getting pregnant.  You have a fever. SEEK IMMEDIATE MEDICAL CARE IF:   Your pain is severe and is not responding to pain medicine.   You have severe nausea and vomiting, or you cannot keep foods down.   You have pain that is limited to the right lower part of your abdomen.   You have swelling or increasing pain in your abdomen.   You see blood in your stool.  MAKE SURE YOU:   Understand these instructions.  Will watch your condition.  Will get help right away if you are not doing well or get worse.   This information is not intended to replace advice given to you by your health care provider. Make sure you discuss any questions you have with your health care provider.   Document Released: 12/01/2000 Document Revised: 12/25/2014 Document Reviewed: 08/01/2013 Elsevier Interactive Patient Education 2016 [employer]    Lichen Sclerosus Lichen sclerosus is a skin problem. It can happen on any part of the body, but it commonly involves the anal or genital areas. It can cause itching and discomfort in these areas. Treatment can help to control symptoms. When the genital area is affected, getting treatment is important because the condition can cause scarring that may lead to other problems. CAUSES The  cause of this condition is not known. It could be the result of an overactive immune system or a lack of certain hormones. Lichen sclerosus is not an infection or a fungus. It is not passed from one person to another (not contagious). RISK FACTORS This condition is more likely to develop in women, usually after menopause. SYMPTOMS Symptoms of this condition include:  Thin, wrinkled, white areas on the skin.  Thickened white areas on the skin.  Red and swollen patches (lesions) on the skin.  Tears or cracks in the skin.  Bruising.  Blood blisters.  Severe itching. You may also have pain, itching, or burning  with urination. Constipation is also common in people with lichen sclerosus. DIAGNOSIS This condition may be diagnosed with a physical exam. In some cases, a tissue sample (biopsy sample) may be removed to be looked at under a microscope. TREATMENT This condition is usually treated with medicated creams or ointments (topical steroids) that are applied over the affected areas. HOME CARE INSTRUCTIONS  Take over-the-counter and prescription medicines only as told by your health care provider.  Use creams or ointments as told by your health care provider.  Do not scratch the affected areas of skin.  Women should keep the vaginal area as clean and dry as possible.  Keep all follow-up visits as told by your health care provider. This is important. SEEK MEDICAL CARE IF:  You have increasing redness, swelling, or pain in the affected area.  You have fluid, blood, or pus coming from the affected area.  You have new lesions on your skin.  You have pain or burning with urination.  You have pain during sex.   This information is not intended to replace advice given to you by your health care provider. Make sure you discuss any questions you have with your health care provider.   Document Released: 04/26/2011 Document Revised: 08/25/2015 Document Reviewed: 03/01/2015 Elsevier Interactive Patient Education [employer]

## 2016-10-28 LAB — QUANTIFERON IN TUBE
QFT TB AG MINUS NIL VALUE: 0.05 [IU]/mL
QUANTIFERON MITOGEN VALUE: 9.87 [IU]/mL
QUANTIFERON NIL VALUE: 0.11 [IU]/mL
QUANTIFERON TB AG VALUE: 0.16 [IU]/mL
QUANTIFERON TB GOLD: NEGATIVE

## 2016-10-28 LAB — QUANTIFERON TB GOLD ASSAY (BLOOD)

## 2016-10-31 LAB — NUSWAB BV AND CANDIDA, NAA
CANDIDA ALBICANS, NAA: NEGATIVE
CANDIDA GLABRATA, NAA: NEGATIVE

## 2016-11-01 LAB — BASIC METABOLIC PANEL
BUN/Creatinine Ratio: 16 (ref 9–23)
BUN: 10 mg/dL (ref 6–20)
CALCIUM: 8.9 mg/dL (ref 8.7–10.2)
CHLORIDE: 104 mmol/L (ref 96–106)
CO2: 22 mmol/L (ref 18–29)
Creatinine, Ser: 0.62 mg/dL (ref 0.57–1.00)
GFR, EST AFRICAN AMERICAN: 149 mL/min/{1.73_m2} (ref >59)
GFR, EST NON AFRICAN AMERICAN: 129 mL/min/{1.73_m2} (ref >59)
Glucose: 70 mg/dL (ref 65–99)
Potassium: 4.1 mmol/L (ref 3.5–5.2)
Sodium: 140 mmol/L (ref 134–144)

## 2016-11-01 LAB — CBC WITH DIFFERENTIAL
Basophils Absolute: 0.1 10*3/uL (ref 0.0–0.2)
Basos: 1 %
EOS (ABSOLUTE): 0.1 10*3/uL (ref 0.0–0.4)
EOS: 2 %
HEMATOCRIT: 39.5 % (ref 34.0–46.6)
Hemoglobin: 12.5 g/dL (ref 11.1–15.9)
IMMATURE GRANULOCYTES: 0 %
Immature Grans (Abs): 0 10*3/uL (ref 0.0–0.1)
LYMPHS ABS: 2.1 10*3/uL (ref 0.7–3.1)
Lymphs: 26 %
MCH: 24.5 pg — ABNORMAL LOW (ref 26.6–33.0)
MCHC: 31.6 g/dL (ref 31.5–35.7)
MCV: 78 fL — AB (ref 79–97)
MONOS ABS: 0.8 10*3/uL (ref 0.1–0.9)
Monocytes: 10 %
NEUTROS PCT: 61 %
Neutrophils Absolute: 5 10*3/uL (ref 1.4–7.0)
RBC: 5.1 x10E6/uL (ref 3.77–5.28)
RDW: 15.9 % — AB (ref 12.3–15.4)
WBC: 8.1 10*3/uL (ref 3.4–10.8)

## 2016-11-01 LAB — HEPATIC FUNCTION PANEL
ALT: 8 IU/L (ref 0–32)
AST: 12 IU/L (ref 0–40)
Albumin: 3.4 g/dL — ABNORMAL LOW (ref 3.5–5.5)
Alkaline Phosphatase: 78 IU/L (ref 39–117)
BILIRUBIN, DIRECT: 0.04 mg/dL (ref 0.00–0.40)
Total Protein: 6.6 g/dL (ref 6.0–8.5)

## 2016-11-01 LAB — THIOPURINE METHYLTRANSFERASE (TPMT), RBC: TPMT Activity:: 24.1 Units/mL RBC

## 2016-11-01 LAB — VITAMIN D 1,25 DIHYDROXY
VITAMIN D3 1, 25 (OH): 83 pg/mL
Vitamin D 1, 25 (OH)2 Total: 91 pg/mL
Vitamin D2 1, 25 (OH)2: <10 pg/mL

## 2016-11-01 LAB — ROTAVIRUS ANTIGEN, STOOL

## 2016-11-01 LAB — C-REACTIVE PROTEIN: CRP: 12.8 mg/L — ABNORMAL HIGH (ref 0.0–4.9)

## 2016-11-01 LAB — CELIAC DISEASE PANEL
Endomysial IgA: NEGATIVE
IGA/IMMUNOGLOBULIN A, SERUM: 297 mg/dL (ref 87–352)
Transglutaminase IgA: <2 U/mL (ref 0–3)

## 2016-11-01 LAB — HEPATITIS A ANTIBODY, TOTAL: Hep A Total Ab: POSITIVE — AB

## 2016-11-01 LAB — SERIAL MONITORING

## 2016-11-01 LAB — ADALIMUMAB+AB (SERIAL MONITOR): ADALIMUMAB DRUG LEVEL: 2.1 ug/mL

## 2016-11-01 LAB — PLEASE NOTE

## 2016-11-01 LAB — HEPATITIS B SURFACE ANTIBODY, QUANTITATIVE: Hepatitis B Surf Ab Quant: 13.4 m[IU]/mL (ref >9.9)

## 2016-11-02 ENCOUNTER — Other Ambulatory Visit
Admission: RE | Admit: 2016-11-02 | Discharge: 2016-11-02 | Disposition: A | Discharge status: Home / Self Care | Payer: 59 | Source: Other Acute Inpatient Hospital | Attending: Gastroenterology | Admitting: Gastroenterology

## 2016-11-02 DIAGNOSIS — K5090 Crohn's disease, unspecified, without complications: Secondary | ICD-10-CM | POA: Diagnosis present

## 2016-11-02 LAB — ROTAVIRUS ANTIGEN, STOOL: Rotavirus: NEGATIVE

## 2016-11-02 LAB — C DIFFICILE QUICK SCREEN W PCR REFLEX
C DIFFICILE (CDIFF) TOXIN: NEGATIVE
C DIFFICLE (CDIFF) ANTIGEN: NEGATIVE
C Diff interpretation: NOT DETECTED

## 2016-11-03 ENCOUNTER — Ambulatory Visit (HOSPITAL_COMMUNITY): Admission: RE | Admit: 2016-11-03 | Payer: 59 | Source: Ambulatory Visit

## 2016-11-03 ENCOUNTER — Ambulatory Visit (HOSPITAL_COMMUNITY)
Admission: RE | Admit: 2016-11-03 | Discharge: 2016-11-03 | Disposition: A | Discharge status: Home / Self Care | Payer: 59 | Source: Ambulatory Visit | Attending: Gastroenterology | Admitting: Gastroenterology

## 2016-11-03 ENCOUNTER — Telehealth: Payer: Self-pay

## 2016-11-03 ENCOUNTER — Other Ambulatory Visit: Payer: Self-pay

## 2016-11-03 DIAGNOSIS — K50919 Crohn's disease, unspecified, with unspecified complications: Secondary | ICD-10-CM

## 2016-11-03 DIAGNOSIS — D509 Iron deficiency anemia, unspecified: Secondary | ICD-10-CM

## 2016-11-03 MED ORDER — GADOBENATE DIMEGLUMINE 529 MG/ML IV SOLN
12.0000 mL | Freq: Once | INTRAVENOUS | Status: AC | PRN
  Administered 2016-11-03: 12 mL via INTRAVENOUS

## 2016-11-03 NOTE — Telephone Encounter (Signed)
-----   Message from [name], MD sent at 11/01/2016  8:39 AM EST ----- Inform   1. Microcytosis on CBC- can we add iron studies and ferritin to the existing profile please, if not possible she will need repeat labs to be done to check if iron deficient 2. CRP elevated suggesting she has active inflammation and MRI will help localize better to guide treatment . 3. She is immune to hep A/ B. No celiac disease

## 2016-11-03 NOTE — Telephone Encounter (Signed)
Pt notified of lab results. New order added to do check iron and ferritin. Pt did have the MRI done today.

## 2016-11-06 LAB — STOOL CULTURE REFLEX - RSASHR

## 2016-11-06 LAB — STOOL CULTURE: E COLI SHIGA TOXIN ASSAY: NEGATIVE

## 2016-11-06 LAB — MISC LABCORP TEST (SEND OUT): LABCORP TEST CODE: 8656

## 2016-11-06 LAB — STOOL CULTURE REFLEX - CMPCXR

## 2016-11-07 LAB — FECAL LACTOFERRIN, QUANT: Lactoferrin, Fecal, Quant.: 75.4 ug/mL(g) — ABNORMAL HIGH (ref 0.00–7.24)

## 2016-11-14 ENCOUNTER — Ambulatory Visit (INDEPENDENT_AMBULATORY_CARE_PROVIDER_SITE_OTHER): Payer: PRIVATE HEALTH INSURANCE | Admitting: Gastroenterology

## 2016-11-14 ENCOUNTER — Ambulatory Visit (INDEPENDENT_AMBULATORY_CARE_PROVIDER_SITE_OTHER): Payer: PRIVATE HEALTH INSURANCE | Admitting: Obstetrics and Gynecology

## 2016-11-14 ENCOUNTER — Encounter: Payer: Self-pay | Admitting: Obstetrics and Gynecology

## 2016-11-14 ENCOUNTER — Encounter
Admission: RE | Admit: 2016-11-14 | Discharge: 2016-11-14 | Disposition: A | Discharge status: Home / Self Care | Payer: 59 | Source: Ambulatory Visit | Attending: Obstetrics and Gynecology | Admitting: Obstetrics and Gynecology

## 2016-11-14 ENCOUNTER — Encounter: Payer: Self-pay | Admitting: Gastroenterology

## 2016-11-14 VITALS — BP 88/54 | HR 103 | Ht 62.0 in | Wt 132.2 lb

## 2016-11-14 VITALS — BP 96/64 | HR 87 | Temp 98.9°F | Ht 62.0 in | Wt 134.6 lb

## 2016-11-14 DIAGNOSIS — D509 Iron deficiency anemia, unspecified: Secondary | ICD-10-CM

## 2016-11-14 DIAGNOSIS — N946 Dysmenorrhea, unspecified: Secondary | ICD-10-CM | POA: Insufficient documentation

## 2016-11-14 DIAGNOSIS — R102 Pelvic and perineal pain: Secondary | ICD-10-CM | POA: Diagnosis present

## 2016-11-14 DIAGNOSIS — Z30431 Encounter for routine checking of intrauterine contraceptive device: Secondary | ICD-10-CM | POA: Insufficient documentation

## 2016-11-14 DIAGNOSIS — D649 Anemia, unspecified: Secondary | ICD-10-CM | POA: Diagnosis not present

## 2016-11-14 DIAGNOSIS — Z30433 Encounter for removal and reinsertion of intrauterine contraceptive device: Secondary | ICD-10-CM

## 2016-11-14 DIAGNOSIS — J45909 Unspecified asthma, uncomplicated: Secondary | ICD-10-CM | POA: Diagnosis not present

## 2016-11-14 DIAGNOSIS — Z01818 Encounter for other preprocedural examination: Secondary | ICD-10-CM | POA: Diagnosis not present

## 2016-11-14 DIAGNOSIS — Z91018 Allergy to other foods: Secondary | ICD-10-CM | POA: Diagnosis not present

## 2016-11-14 DIAGNOSIS — N941 Unspecified dyspareunia: Secondary | ICD-10-CM | POA: Diagnosis not present

## 2016-11-14 DIAGNOSIS — Z809 Family history of malignant neoplasm, unspecified: Secondary | ICD-10-CM | POA: Diagnosis not present

## 2016-11-14 DIAGNOSIS — L820 Inflamed seborrheic keratosis: Secondary | ICD-10-CM | POA: Diagnosis not present

## 2016-11-14 DIAGNOSIS — Z975 Presence of (intrauterine) contraceptive device: Secondary | ICD-10-CM | POA: Diagnosis not present

## 2016-11-14 DIAGNOSIS — F418 Other specified anxiety disorders: Secondary | ICD-10-CM | POA: Diagnosis not present

## 2016-11-14 DIAGNOSIS — G8929 Other chronic pain: Secondary | ICD-10-CM | POA: Diagnosis not present

## 2016-11-14 DIAGNOSIS — Z01810 Encounter for preprocedural cardiovascular examination: Secondary | ICD-10-CM | POA: Diagnosis present

## 2016-11-14 DIAGNOSIS — K5000 Crohn's disease of small intestine without complications: Secondary | ICD-10-CM

## 2016-11-14 DIAGNOSIS — R718 Other abnormality of red blood cells: Secondary | ICD-10-CM

## 2016-11-14 DIAGNOSIS — N762 Acute vulvitis: Secondary | ICD-10-CM | POA: Insufficient documentation

## 2016-11-14 DIAGNOSIS — N763 Subacute and chronic vulvitis: Secondary | ICD-10-CM | POA: Diagnosis not present

## 2016-11-14 DIAGNOSIS — L83 Acanthosis nigricans: Secondary | ICD-10-CM | POA: Diagnosis not present

## 2016-11-14 DIAGNOSIS — R234 Changes in skin texture: Secondary | ICD-10-CM | POA: Diagnosis not present

## 2016-11-14 DIAGNOSIS — Z01812 Encounter for preprocedural laboratory examination: Secondary | ICD-10-CM | POA: Diagnosis present

## 2016-11-14 DIAGNOSIS — F4310 Post-traumatic stress disorder, unspecified: Secondary | ICD-10-CM | POA: Diagnosis not present

## 2016-11-14 DIAGNOSIS — K5090 Crohn's disease, unspecified, without complications: Secondary | ICD-10-CM | POA: Diagnosis not present

## 2016-11-14 DIAGNOSIS — N801 Endometriosis of ovary: Secondary | ICD-10-CM | POA: Diagnosis not present

## 2016-11-14 HISTORY — DX: Gastro-esophageal reflux disease without esophagitis: K21.9

## 2016-11-14 HISTORY — DX: Anxiety disorder, unspecified: F41.9

## 2016-11-14 LAB — RAPID HIV SCREEN (HIV 1/2 AB+AG)
HIV 1/2 Antibodies: NONREACTIVE
HIV-1 P24 ANTIGEN - HIV24: NONREACTIVE

## 2016-11-14 LAB — CBC WITH DIFFERENTIAL/PLATELET
Basophils Absolute: 0 10*3/uL (ref 0–0.1)
Basophils Relative: 1 %
EOS ABS: 0.1 10*3/uL (ref 0–0.7)
Eosinophils Relative: 2 %
HCT: 37.4 % (ref 35.0–47.0)
Hemoglobin: 12.1 g/dL (ref 12.0–16.0)
LYMPHS ABS: 1.8 10*3/uL (ref 1.0–3.6)
Lymphocytes Relative: 27 %
MCH: 24.8 pg — AB (ref 26.0–34.0)
MCHC: 32.2 g/dL (ref 32.0–36.0)
MCV: 76.9 fL — ABNORMAL LOW (ref 80.0–100.0)
MONOS PCT: 7 %
Monocytes Absolute: 0.5 10*3/uL (ref 0.2–0.9)
Neutro Abs: 4.4 10*3/uL (ref 1.4–6.5)
Neutrophils Relative %: 63 %
PLATELETS: 205 10*3/uL (ref 150–440)
RBC: 4.86 MIL/uL (ref 3.80–5.20)
RDW: 16.3 % — AB (ref 11.5–14.5)
WBC: 6.9 10*3/uL (ref 3.6–11.0)

## 2016-11-14 LAB — TYPE AND SCREEN
ABO/RH(D): A POS
Antibody Screen: NEGATIVE

## 2016-11-14 MED ORDER — BUDESONIDE 3 MG PO CPEP: 9.0000 mg | ORAL_CAPSULE | Freq: Every day | ORAL | 0 refills | Status: DC

## 2016-11-14 NOTE — Patient Instructions (Signed)
1.  Return for postop check 1 week after surgery 

## 2016-11-14 NOTE — Progress Notes (Signed)
Subjective:  PREOPERATIVE HISTORY AND PHYSICAL     Patient is a 20 y.o. [id number] scheduled for Surgery on 11/20/2016.  Planned procedures: 1. Laparoscopy with peritoneal biopsies 2. Vulvar biopsy 3. Removal and reinsertion of IUD   Chronic vulvitis: Patient has not had any improvement in symptomatology with utilizing metronidazole as well as nystatin/triamcinolone cream topically to the vulva. She continues to have significant itching and burning and pain.Clinical exam is notable for moderate to severe vulvar hyperemia located in the perianal posterior fourchette region; possible flat wart at 10:00 in relation to the anus; to be biopsied in same day surgery  Encounter for IUD management: Patient was to have Skyla removed and new Skyla reinserted. However, upon further history taking, it appears that the patient may have endometriosis. If so, a better management plan would be to confirm the diagnosis at laparoscopy, and then insert a Mirena for better treatment of this condition. She is willing to proceed with laparoscopy with biopsies and prefers to have a Mirena inserted at the time of this outpatient surgery.  Chronic pelvic pain: Patient continues to have significant pelvic pain in the form of low back pain with radiation into the buttocks and thighs during menses (when they occur). She continues to experience deep thrusting dyspareunia to a point where she does not wish to engage in relations. Laparoscopy is to be performed to rule out endometriosis.    Pertinent Gynecological History: No LMP recorded. Patient is not currently having periods (Reason: IUD). Contraception: IUD Last Pap: Never Last mammogram: N/A   OB History    Gravida Para Term Preterm AB Living   0 0 0 0 0 0   SAB TAB Ectopic Multiple Live Births   0 0 0 0 0      Menarche age: NA No LMP recorded (lmp unknown). Patient is not currently having periods (Reason: IUD).    Past Medical History:  Diagnosis  Date  . Anemia   . Asthma   . Crohn's disease (HCC)   . Environmental allergies   . Exercise-induced asthma   . PTSD (post-traumatic stress disorder)   . Vaso vagal episode    POTS-followed by [facility]  . Vomiting and diarrhea     Past Surgical History:  Procedure Laterality Date  . INTRAUTERINE DEVICE INSERTION      OB History  Gravida Para Term Preterm AB Living  0 0 0 0 0 0  SAB TAB Ectopic Multiple Live Births  0 0 0 0 0        Social History   Social History  . Marital status: Single    Spouse name: N/A  . Number of children: N/A  . Years of education: N/A   Social History Main Topics  . Smoking status: Never Smoker  . Smokeless tobacco: Never Used  . Alcohol use 0.0 oz/week     Comment: occasional  . Drug use: No     Comment: Marijuana occ-last June  . Sexual activity: Not Currently    Partners: Male    Birth control/ protection: IUD   Other Topics Concern  . None   Social History Narrative  . None    Family History  Problem Relation Age of Onset  . Cancer Paternal Grandmother   . Cancer Paternal Grandfather   . Colon cancer Neg Hx   . Crohn's disease Neg Hx      (Not in a hospital admission)  Allergies  Allergen Reactions  . Other Other (See Comments)  PT. STATES SHE HAS BEEN ADVISED TO AVOID ANTIBIOTICS DUE TO C. DIFF CAUSED BY ANTIBIOTIC USE  . Pistachio Nut (Diagnostic) Anaphylaxis    TREE NUT ALLERGY (almonds, Brazil nuts, cashews, chestnuts, filberts/hazelnuts, macadamia nuts, pecans, pistachios, pine nuts, shea nuts,walnuts)    Review of Systems Constitutional: No recent fever/chills/sweats Respiratory: No recent cough/bronchitis Cardiovascular: No chest pain Gastrointestinal: No recent nausea/vomiting/diarrhea Genitourinary: No UTI symptoms Hematologic/lymphatic:No history of coagulopathy or recent blood thinner use    Objective:    BP (!) 88/54   Pulse (!) 103   Ht 5' 2" (1.575 m)   Wt 132 lb 3.2 oz (60 kg)   LMP   (LMP Unknown)   BMI 24.18 kg/m   General:   Normal  Skin:   normal  HEENT:  Normal  Neck:  Supple without Adenopathy or Thyromegaly  Lungs:   Heart:              Breasts:   Abdomen:  Pelvis:  M/S   Extremeties:  Neuro:    clear to auscultation bilaterally   Normal without murmur   Not Examined   soft, non-tender; bowel sounds normal; no masses,  no organomegaly   Exam deferred to OR  No CVAT  Warm/Dry   Normal        10/04/2016 PELVIC:             External Genitalia: Moderate to severe vulvar hyperemia located in the perianal posterior fourchette region; possible flat wart at 10:00 in relation to anus             BUS: Normal             Vagina: Normal; white secretions in vault (patient used [employer] yesterday)             Cervix: Normal; no Neyra lesions; IUD string not visualized             Uterus: Normal size, shape,consistency, mobile, nontender             Adnexa: Normal             RV: Normal              Bladder: Nontender  Assessment:    1. Chronic pelvic pain/dysmenorrhea 2. Chronic vulvitis 3. Contraceptive management (IUD removal and reinsertion)   Plan:  1. Laparoscopy with peritoneal biopsies 2. Vulvar biopsy 3. IUD removal and Mirena insertion  Preop counseling: The patient is to undergo laparoscopy with peritoneal biopsies, IUD removal and IUD reinsertion, and vulvar biopsy on 11/20/2016. She is understanding of the planned procedures and is aware of and is accepting of all surgical risks which include but are not limited to bleeding, infection, pelvic organ injury with need for repair, blood clot disorders, anesthesia risks, etc. All questions have been answered. Informed consent is given. Patient is ready and willing to proceed with surgery as scheduled.  Penny Eid A Rayman Petrosian, MD  Note: This dictation was prepared with Dragon dictation along with smaller phrase technology. Any transcriptional errors that result from this process are  unintentional.  

## 2016-11-14 NOTE — Patient Instructions (Addendum)
  Your procedure is scheduled on: November 20, 2016 (Monday) Report to Same Day Surgery 2nd floor medical mall [facility] Entrance-take elevator on left to 2nd floor.  Check in with surgery information desk.) To find out your arrival time please call [phone] between 1PM - 3PM on November 17, 2016 (Friday)  Remember: Instructions that are not followed completely may result in serious medical risk, up to and including death, or upon the discretion of your surgeon and anesthesiologist your surgery may need to be rescheduled.    _x___ 1. Do not eat food or drink liquids after midnight. No gum chewing or hard candies.     __x__ 2. No Alcohol for 24 hours before or after surgery.   __x__3. No Smoking for 24 prior to surgery.   ____  4. Bring all medications with you on the day of surgery if instructed.    __x__ 5. Notify your doctor if there is any change in your medical condition     (cold, fever, infections).     Do not wear jewelry, make-up, hairpins, clips or nail polish.  Do not wear lotions, powders, or perfumes. You may wear deodorant.  Do not shave 48 hours prior to surgery. Men may shave face and neck.  Do not bring valuables to the hospital.    [facility] is not responsible for any belongings or valuables.               Contacts, dentures or bridgework may not be worn into surgery.  Leave your suitcase in the car. After surgery it may be brought to your room.  For patients admitted to the hospital, discharge time is determined by your treatment team.   Patients discharged the day of surgery will not be allowed to drive home.  You will need someone to drive you home and stay with you the night of your procedure.    Please read over the following fact sheets that you were given:   [facility] Preparing for Surgery and or MRSA Information   _x___ Take these medicines the morning of surgery with A SIP OF WATER:    1. Zoloft  2.  3.  4.  5.  6.  ____Fleets enema or  Magnesium Citrate as directed.   _x___ Use CHG Soap or sage wipes as directed on instruction sheet   _x___ Use inhalers on the day of surgery and bring to hospital day of surgery (Use Albuterol inhaler the day of surgery an d bring to hospital)  ____ Stop metformin 2 days prior to surgery    ____ Take 1/2 of usual insulin dose the night before surgery and none on the morning of           surgery.   __x__ Stop Aspirin, Coumadin, Pllavix ,Eliquis, Effient, or Pradaxa  x__ Stop Anti-inflammatories such as Advil, Aleve, Ibuprofen, Motrin, Naproxen,          Naprosyn, Goodies powders or aspirin products. Ok to take Tylenol.   ____ Stop supplements until after surgery.    ____ Bring C-Pap to the hospital.

## 2016-11-14 NOTE — Progress Notes (Signed)
Primary Care Physician: Penny Ho, [name], MD  Primary Gastroenterologist:  Dr. Wyline MoodKiran Diane Ho   Chief Complaint  Patient presents with  . Follow-up    MRI and Labs    HPI: Penny Ho is a 20 y.o. female .She is here today to follow up to her last office visit on 10/25/2016 for crohns disease .    Summary of history :  Diagnosis and date:  Dec 2014, diagnosed here , unsure area affected.  Prior surgeries: None  Present medication:  Humira, every other week since 02/2014 , last flare she states presently , prior to this says been getting worse over the past few months. She had adequate drug levels in 2015 and low antibody titre. Never been on Imuran or methotrexate  Last dose of Humira was 2 weeks back due today.    Last MRE:  11/03/2016 long segment of diseased distal and termonal ileum similar to CT in 2015 .  Last EGD/Colonoscopy: Last colonoscopy 11/2013- ?ileal disease and sigmoid disease- commenced on pentasa and entocort Smoking status: never  Use of NSAID's :  Alleve-as needed , using every other day last 3-4 days  Last use of steroids: 11/2015 -prednisone for 2 weeks Chronic steroid use : no  Last DEXA scan : no  Hep B/A,HIV: immunization status: immune and negative HIV ab in 10/04/16.   Last Tdap, pneumonia and flu shot: Unsure about tdap but thinks she has had recently .  Vitamin D deficiency :  B12 deficiency :  Not checked   TPMT and TB statis: TB quantiferon 10/25/16- negative  . Normal TPMT enzyme activity.  Celiac serology: negative  ADA antibody negative and adequate levels of adalimumab 10/26/16   Interval history 10/25/2016-11/14/2016  Having some diarrhea. Some abdominal pain on and off . No NSAID's   Hb 12.5, MCV 78,LFT,BMP-normal ,stool for C diff and culture negative.  CRP 12.8 , fecal lactoferrin elevated. Pregnancy test negative.   Current Outpatient Prescriptions  Medication Sig Dispense Refill  . albuterol (PROVENTIL HFA;VENTOLIN HFA) 108 (90  Base) MCG/ACT inhaler Inhale 1-2 puffs into the lungs every 6 (six) hours as needed for wheezing or shortness of breath.    Marland Kitchen. gentamicin ointment (GARAMYCIN) 0.1 % APPLY TO PAINFUL EAR ONCE TO TWICE DAILY AS NEEDED FOR PAIN.  8  . HUMIRA PEN 40 MG/0.8ML PNKT Inject 40 mg as directed every 14 (fourteen) days. 2 each 11  . Levonorgestrel (SKYLA) 13.5 MG IUD 1 Device by Intrauterine route as directed. EVERY 3 YEARS    . olopatadine (PATANOL) 0.1 % ophthalmic solution APPLY 1 DROP INTO BOTH EYES TWICE DAILY AS NEEDED FOR ALLERGY EYES  3  . sertraline (ZOLOFT) 100 MG tablet Take 50 mg by mouth daily.   5  . topiramate (TOPAMAX) 50 MG tablet 1 TABLET BY MOUTH TWICE A DAY  0   No current facility-administered medications for this visit.     Allergies as of 11/14/2016 - Review Complete 11/14/2016  Allergen Reaction Noted  . Other Other (See Comments) 12/01/2015  . 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 ENT: Negative for hoarseness, difficulty swallowing , nasal congestion. CV: Negative for chest pain, angina, palpitations, dyspnea on exertion, peripheral edema.  Respiratory: Negative for dyspnea at rest, dyspnea on exertion, cough, sputum, wheezing.  GI: See history of present illness. GU:  Negative for dysuria, hematuria, urinary incontinence, urinary frequency, nocturnal urination.  Endo: Negative for unusual weight  change.    Physical Examination:   BP 96/64   Pulse 87   Temp 98.9 F (37.2 C) (Oral)   Ht 5\' 2"  (1.575 m)   Wt 134 lb 9.6 oz (61.1 kg)   LMP  (LMP Unknown)   BMI 24.62 kg/m   General: Well-nourished, well-developed in no acute distress.  Eyes: No icterus. Conjunctivae pink. Mouth: Oropharyngeal mucosa moist and pink , no lesions erythema or exudate. Lungs: Clear to auscultation bilaterally. Non-labored. Heart: Regular rate and rhythm, no murmurs rubs or gallops.    Abdomen: Bowel sounds are normal, nontender, nondistended, no hepatosplenomegaly or masses, no abdominal bruits or hernia , no rebound or guarding.   Extremities: No lower extremity edema. No clubbing or deformities. Neuro: Alert and oriented x 3.  Grossly intact. Skin: Warm and dry, no jaundice.   Psych: Alert and cooperative, normal mood and affect.    Imaging Studies: Mr Penny Ho Abdomen W Wo Contrast  Result Date: 11/03/2016 CLINICAL DATA:  20 year old with history of Crohn's disease with abdominal pain and cramping. EXAM: MR ABDOMEN AND PELVIS WITHOUT AND WITH CONTRAST (MR ENTEROGRAPHY) TECHNIQUE: Multiplanar, multisequence MRI of the abdomen and pelvis was performed both before and during bolus administration of intravenous contrast. Negative oral contrast VoLumen was given. CONTRAST:  12mL MULTIHANCE GADOBENATE DIMEGLUMINE 529 MG/ML IV SOLN COMPARISON:  CT scan 04/06/2014 FINDINGS: COMBINED FINDINGS FOR BOTH MR ABDOMEN AND PELVIS Lower chest: The lung bases are clear. No pulmonary nodules, pleural or pericardial effusion. Hepatobiliary: No worrisome hepatic lesions. No intrahepatic biliary dilatation. The portal and hepatic veins are patent. The gallbladder is normal. No common bile duct dilatation. Pancreas:  No mass, inflammation or ductal dilatation. Spleen:  Normal size.  No focal lesions. Adrenals/Urinary Tract:  The adrenal glands and kidneys are normal. Stomach/Bowel: The stomach, duodenum and proximal small bowel are unremarkable. No inflammatory changes or obstructive findings. There is a long segment of inflammatory bowel disease involving the distal and terminal ileum which looks very similar to the prior CT scan 2015. There is marked mucosal and serosal enhancement and submucosal edema. No abscess or fistula. No colonic involvement. Vascular/Lymphatic: The aorta and branch vessels are normal. The major venous structures are normal. No mesenteric or retroperitoneal mass or adenopathy.  Reproductive: The uterus and ovaries are normal. Other: No pelvic mass or adenopathy. No free pelvic fluid collections. No inguinal mass or adenopathy. Musculoskeletal: No significant bony findings. IMPRESSION: 1. Long segment of Crohn's disease involving the distal and terminal ileum. No abscess, fistula or obstruction. No colonic involvement. 2. No other significant abdominal/pelvic findings. Electronically Signed   By: Rudie Meyer M.D.   On: 11/03/2016 15:45   Mr Penny Ho Pelvis W NW Contrast  Result Date: 11/03/2016 CLINICAL DATA:  20 year old with history of Crohn's disease with abdominal pain and cramping. EXAM: MR ABDOMEN AND PELVIS WITHOUT AND WITH CONTRAST (MR ENTEROGRAPHY) TECHNIQUE: Multiplanar, multisequence MRI of the abdomen and pelvis was performed both before and during bolus administration of intravenous contrast. Negative oral contrast VoLumen was given. CONTRAST:  12mL MULTIHANCE GADOBENATE DIMEGLUMINE 529 MG/ML IV SOLN COMPARISON:  CT scan 04/06/2014 FINDINGS: COMBINED FINDINGS FOR BOTH MR ABDOMEN AND PELVIS Lower chest: The lung bases are clear. No pulmonary nodules, pleural or pericardial effusion. Hepatobiliary: No worrisome hepatic lesions. No intrahepatic biliary dilatation. The portal and hepatic veins are patent. The gallbladder is normal. No common bile duct dilatation. Pancreas:  No mass, inflammation or ductal dilatation. Spleen:  Normal size.  No focal lesions. Adrenals/Urinary Tract:  The adrenal glands and kidneys are normal. Stomach/Bowel: The stomach, duodenum and proximal small bowel are unremarkable. No inflammatory changes or obstructive findings. There is a long segment of inflammatory bowel disease involving the distal and terminal ileum which looks very similar to the prior CT scan 2015. There is marked mucosal and serosal enhancement and submucosal edema. No abscess or fistula. No colonic involvement. Vascular/Lymphatic: The aorta and branch vessels are normal. The  major venous structures are normal. No mesenteric or retroperitoneal mass or adenopathy. Reproductive: The uterus and ovaries are normal. Other: No pelvic mass or adenopathy. No free pelvic fluid collections. No inguinal mass or adenopathy. Musculoskeletal: No significant bony findings. IMPRESSION: 1. Long segment of Crohn's disease involving the distal and terminal ileum. No abscess, fistula or obstruction. No colonic involvement. 2. No other significant abdominal/pelvic findings. Electronically Signed   By: Rudie Meyer M.D.   On: 11/03/2016 15:45    Assessment and Plan:   Shayana Colo is a 20 y.o. 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  Plan  1.  I will plan to change class of biologics to Stelara with combination therapy using imuran at 100 mg a day .while I receive authorization for Stelara , will plan to commence her on Entocort.I will bring her back right before we start her on these new meds to discuss again the risks vs benefits.    2. Microcytosis- check iron , b12,folate,ferritin   I discussed risks and benefits of both these medications, need to  avoid NSAID's and compliance.   F/u in 2-3 weeks  Dr [name]  MD

## 2016-11-14 NOTE — H&P (Signed)
Subjective:  PREOPERATIVE HISTORY AND PHYSICAL     Patient is a 20 y.o. [id number] scheduled for Surgery on 11/20/2016.  Planned procedures: 1. Laparoscopy with peritoneal biopsies 2. Vulvar biopsy 3. Removal and reinsertion of IUD   Chronic vulvitis: Patient has not had any improvement in symptomatology with utilizing metronidazole as well as nystatin/triamcinolone cream topically to the vulva. She continues to have significant itching and burning and pain.Clinical exam is notable for moderate to severe vulvar hyperemia located in the perianal posterior fourchette region; possible flat wart at 10:00 in relation to the anus; to be biopsied in same day surgery  Encounter for IUD management: Patient was to have Skyla removed and new Skyla reinserted. However, upon further history taking, it appears that the patient may have endometriosis. If so, a better management plan would be to confirm the diagnosis at laparoscopy, and then insert a Mirena for better treatment of this condition. She is willing to proceed with laparoscopy with biopsies and prefers to have a Mirena inserted at the time of this outpatient surgery.  Chronic pelvic pain: Patient continues to have significant pelvic pain in the form of low back pain with radiation into the buttocks and thighs during menses (when they occur). She continues to experience deep thrusting dyspareunia to a point where she does not wish to engage in relations. Laparoscopy is to be performed to rule out endometriosis.    Pertinent Gynecological History: No LMP recorded. Patient is not currently having periods (Reason: IUD). Contraception: IUD Last Pap: Never Last mammogram: N/A   OB History    Gravida Para Term Preterm AB Living   0 0 0 0 0 0   SAB TAB Ectopic Multiple Live Births   0 0 0 0 0      Menarche age: NA No LMP recorded (lmp unknown). Patient is not currently having periods (Reason: IUD).    Past Medical History:  Diagnosis  Date  . Anemia   . Asthma   . Crohn's disease (HCC)   . Environmental allergies   . Exercise-induced asthma   . PTSD (post-traumatic stress disorder)   . Vaso vagal episode    POTS-followed by [facility]  . Vomiting and diarrhea     Past Surgical History:  Procedure Laterality Date  . INTRAUTERINE DEVICE INSERTION      OB History  Gravida Para Term Preterm AB Living  0 0 0 0 0 0  SAB TAB Ectopic Multiple Live Births  0 0 0 0 0        Social History   Social History  . Marital status: Single    Spouse name: N/A  . Number of children: N/A  . Years of education: N/A   Social History Main Topics  . Smoking status: Never Smoker  . Smokeless tobacco: Never Used  . Alcohol use 0.0 oz/week     Comment: occasional  . Drug use: No     Comment: Marijuana occ-last June  . Sexual activity: Not Currently    Partners: Male    Birth control/ protection: IUD   Other Topics Concern  . None   Social History Narrative  . None    Family History  Problem Relation Age of Onset  . Cancer Paternal Grandmother   . Cancer Paternal Grandfather   . Colon cancer Neg Hx   . Crohn's disease Neg Hx      (Not in a hospital admission)  Allergies  Allergen Reactions  . Other Other (See Comments)  PT. STATES SHE HAS BEEN ADVISED TO AVOID ANTIBIOTICS DUE TO C. DIFF CAUSED BY ANTIBIOTIC USE  . Pistachio Nut (Diagnostic) Anaphylaxis    TREE NUT ALLERGY (almonds, Brazil nuts, cashews, chestnuts, filberts/hazelnuts, macadamia nuts, pecans, pistachios, pine nuts, shea nuts,walnuts)    Review of Systems Constitutional: No recent fever/chills/sweats Respiratory: No recent cough/bronchitis Cardiovascular: No chest pain Gastrointestinal: No recent nausea/vomiting/diarrhea Genitourinary: No UTI symptoms Hematologic/lymphatic:No history of coagulopathy or recent blood thinner use    Objective:    BP (!) 88/54   Pulse (!) 103   Ht 5' 2" (1.575 m)   Wt 132 lb 3.2 oz (60 kg)   LMP   (LMP Unknown)   BMI 24.18 kg/m   General:   Normal  Skin:   normal  HEENT:  Normal  Neck:  Supple without Adenopathy or Thyromegaly  Lungs:   Heart:              Breasts:   Abdomen:  Pelvis:  M/S   Extremeties:  Neuro:    clear to auscultation bilaterally   Normal without murmur   Not Examined   soft, non-tender; bowel sounds normal; no masses,  no organomegaly   Exam deferred to OR  No CVAT  Warm/Dry   Normal        10/04/2016 PELVIC:             External Genitalia: Moderate to severe vulvar hyperemia located in the perianal posterior fourchette region; possible flat wart at 10:00 in relation to anus             BUS: Normal             Vagina: Normal; white secretions in vault (patient used [employer] yesterday)             Cervix: Normal; no Jafri lesions; IUD string not visualized             Uterus: Normal size, shape,consistency, mobile, nontender             Adnexa: Normal             RV: Normal              Bladder: Nontender  Assessment:    1. Chronic pelvic pain/dysmenorrhea 2. Chronic vulvitis 3. Contraceptive management (IUD removal and reinsertion)   Plan:  1. Laparoscopy with peritoneal biopsies 2. Vulvar biopsy 3. IUD removal and Mirena insertion  Preop counseling: The patient is to undergo laparoscopy with peritoneal biopsies, IUD removal and IUD reinsertion, and vulvar biopsy on 11/20/2016. She is understanding of the planned procedures and is aware of and is accepting of all surgical risks which include but are not limited to bleeding, infection, pelvic organ injury with need for repair, blood clot disorders, anesthesia risks, etc. All questions have been answered. Informed consent is given. Patient is ready and willing to proceed with surgery as scheduled.  Penny Kirley A Hudsyn Champine, MD  Note: This dictation was prepared with Dragon dictation along with smaller phrase technology. Any transcriptional errors that result from this process are  unintentional.  

## 2016-11-14 NOTE — Patient Instructions (Signed)
We will see you back in 2 weeks as scheduled below to discuss beginning Stelara for your Crohn's disease. In the meantime, we will gather all of the details of your infusions and we will discuss this at your appointment.  Please go to the lab today and have your Iron studies drawn.

## 2016-11-15 LAB — IRON AND TIBC
IRON: 11 ug/dL — AB (ref 28–170)
Saturation Ratios: 4 % — ABNORMAL LOW (ref 10.4–31.8)
TIBC: 284 ug/dL (ref 250–450)
UIBC: 273 ug/dL

## 2016-11-15 LAB — RPR: RPR Ser Ql: NONREACTIVE

## 2016-11-20 ENCOUNTER — Ambulatory Visit: Payer: 59 | Admitting: Anesthesiology

## 2016-11-20 ENCOUNTER — Ambulatory Visit
Admission: RE | Admit: 2016-11-20 | Discharge: 2016-11-20 | Disposition: A | Discharge status: Home / Self Care | Payer: 59 | Source: Ambulatory Visit | Attending: Obstetrics and Gynecology | Admitting: Obstetrics and Gynecology

## 2016-11-20 ENCOUNTER — Encounter
Admission: RE | Disposition: A | Discharge status: Home / Self Care | Payer: Self-pay | Source: Ambulatory Visit | Attending: Obstetrics and Gynecology

## 2016-11-20 ENCOUNTER — Encounter: Payer: Self-pay | Admitting: *Deleted

## 2016-11-20 DIAGNOSIS — D649 Anemia, unspecified: Secondary | ICD-10-CM | POA: Insufficient documentation

## 2016-11-20 DIAGNOSIS — R102 Pelvic and perineal pain: Secondary | ICD-10-CM | POA: Diagnosis not present

## 2016-11-20 DIAGNOSIS — N763 Subacute and chronic vulvitis: Secondary | ICD-10-CM | POA: Insufficient documentation

## 2016-11-20 DIAGNOSIS — Z91018 Allergy to other foods: Secondary | ICD-10-CM | POA: Insufficient documentation

## 2016-11-20 DIAGNOSIS — G8929 Other chronic pain: Secondary | ICD-10-CM

## 2016-11-20 DIAGNOSIS — F418 Other specified anxiety disorders: Secondary | ICD-10-CM | POA: Insufficient documentation

## 2016-11-20 DIAGNOSIS — Z975 Presence of (intrauterine) contraceptive device: Secondary | ICD-10-CM | POA: Insufficient documentation

## 2016-11-20 DIAGNOSIS — Z9889 Other specified postprocedural states: Secondary | ICD-10-CM

## 2016-11-20 DIAGNOSIS — N941 Unspecified dyspareunia: Secondary | ICD-10-CM

## 2016-11-20 DIAGNOSIS — J45909 Unspecified asthma, uncomplicated: Secondary | ICD-10-CM | POA: Insufficient documentation

## 2016-11-20 DIAGNOSIS — N762 Acute vulvitis: Secondary | ICD-10-CM

## 2016-11-20 DIAGNOSIS — N801 Endometriosis of ovary: Secondary | ICD-10-CM | POA: Diagnosis not present

## 2016-11-20 DIAGNOSIS — F4310 Post-traumatic stress disorder, unspecified: Secondary | ICD-10-CM | POA: Insufficient documentation

## 2016-11-20 DIAGNOSIS — K5090 Crohn's disease, unspecified, without complications: Secondary | ICD-10-CM | POA: Insufficient documentation

## 2016-11-20 DIAGNOSIS — Z809 Family history of malignant neoplasm, unspecified: Secondary | ICD-10-CM | POA: Insufficient documentation

## 2016-11-20 DIAGNOSIS — N946 Dysmenorrhea, unspecified: Secondary | ICD-10-CM | POA: Insufficient documentation

## 2016-11-20 DIAGNOSIS — K50919 Crohn's disease, unspecified, with unspecified complications: Secondary | ICD-10-CM | POA: Diagnosis not present

## 2016-11-20 DIAGNOSIS — L83 Acanthosis nigricans: Secondary | ICD-10-CM | POA: Insufficient documentation

## 2016-11-20 DIAGNOSIS — Z30433 Encounter for removal and reinsertion of intrauterine contraceptive device: Secondary | ICD-10-CM | POA: Diagnosis not present

## 2016-11-20 DIAGNOSIS — R234 Changes in skin texture: Secondary | ICD-10-CM | POA: Insufficient documentation

## 2016-11-20 DIAGNOSIS — L820 Inflamed seborrheic keratosis: Secondary | ICD-10-CM | POA: Insufficient documentation

## 2016-11-20 HISTORY — PX: INTRAUTERINE DEVICE (IUD) INSERTION: SHX5877

## 2016-11-20 HISTORY — PX: LAPAROSCOPY: SHX197

## 2016-11-20 HISTORY — PX: VULVA /PERINEUM BIOPSY: SHX319

## 2016-11-20 HISTORY — PX: IUD REMOVAL: SHX5392

## 2016-11-20 LAB — POCT PREGNANCY, URINE: Preg Test, Ur: NEGATIVE

## 2016-11-20 LAB — ABO/RH: ABO/RH(D): A POS

## 2016-11-20 SURGERY — LAPAROSCOPY, DIAGNOSTIC
Anesthesia: General

## 2016-11-20 MED ORDER — LIDOCAINE HCL (PF) 4 % IJ SOLN
INTRAMUSCULAR | Status: DC | PRN
  Administered 2016-11-20: 4 mL via RESPIRATORY_TRACT

## 2016-11-20 MED ORDER — FAMOTIDINE 20 MG PO TABS
20.0000 mg | ORAL_TABLET | Freq: Once | ORAL | Status: AC
  Administered 2016-11-20: 20 mg via ORAL

## 2016-11-20 MED ORDER — FENTANYL CITRATE (PF) 100 MCG/2ML IJ SOLN
INTRAMUSCULAR | Status: AC
  Administered 2016-11-20: 25 ug via INTRAVENOUS
  Filled 2016-11-20: qty 2

## 2016-11-20 MED ORDER — HYDROMORPHONE HCL 1 MG/ML IJ SOLN
INTRAMUSCULAR | Status: DC | PRN
  Administered 2016-11-20: 1 mg via INTRAVENOUS

## 2016-11-20 MED ORDER — LACTATED RINGERS IV SOLN
INTRAVENOUS | Status: DC
  Administered 2016-11-20 (×2): via INTRAVENOUS

## 2016-11-20 MED ORDER — GLYCOPYRROLATE 0.2 MG/ML IJ SOLN
INTRAMUSCULAR | Status: DC | PRN
  Administered 2016-11-20: 0.4 mg via INTRAVENOUS

## 2016-11-20 MED ORDER — PROMETHAZINE HCL 25 MG/ML IJ SOLN: 6.2500 mg | INTRAMUSCULAR | Status: DC | PRN

## 2016-11-20 MED ORDER — DEXAMETHASONE SODIUM PHOSPHATE 4 MG/ML IJ SOLN
INTRAMUSCULAR | Status: DC | PRN
  Administered 2016-11-20: 8 mg via INTRAVENOUS

## 2016-11-20 MED ORDER — ONDANSETRON HCL 4 MG/2ML IJ SOLN
INTRAMUSCULAR | Status: DC | PRN
Start: 2016-11-20 — End: 2016-11-20
  Administered 2016-11-20: 4 mg via INTRAVENOUS

## 2016-11-20 MED ORDER — PHENYLEPHRINE HCL 10 MG/ML IJ SOLN
INTRAMUSCULAR | Status: DC | PRN
  Administered 2016-11-20 (×3): 100 ug via INTRAVENOUS

## 2016-11-20 MED ORDER — LIDOCAINE HCL (PF) 1 % IJ SOLN
INTRAMUSCULAR | Status: AC
  Filled 2016-11-20: qty 30

## 2016-11-20 MED ORDER — OXYCODONE HCL 5 MG PO TABS
5.0000 mg | ORAL_TABLET | Freq: Once | ORAL | Status: AC | PRN
  Administered 2016-11-20: 5 mg via ORAL

## 2016-11-20 MED ORDER — KETAMINE HCL 50 MG/ML IJ SOLN
INTRAMUSCULAR | Status: DC | PRN
  Administered 2016-11-20: 30 mg via INTRAVENOUS

## 2016-11-20 MED ORDER — FENTANYL CITRATE (PF) 100 MCG/2ML IJ SOLN
INTRAMUSCULAR | Status: DC | PRN
  Administered 2016-11-20: 250 ug via INTRAVENOUS

## 2016-11-20 MED ORDER — KETOROLAC TROMETHAMINE 30 MG/ML IJ SOLN
INTRAMUSCULAR | Status: DC | PRN
  Administered 2016-11-20: 30 mg via INTRAVENOUS

## 2016-11-20 MED ORDER — MEPERIDINE HCL 25 MG/ML IJ SOLN: 6.2500 mg | INTRAMUSCULAR | Status: DC | PRN

## 2016-11-20 MED ORDER — IBUPROFEN 800 MG PO TABS: 800.0000 mg | ORAL_TABLET | Freq: Three times a day (TID) | ORAL | 1 refills | Status: DC

## 2016-11-20 MED ORDER — OXYCODONE-ACETAMINOPHEN 5-325 MG PO TABS: 1.0000 | ORAL_TABLET | ORAL | 0 refills | Status: DC | PRN

## 2016-11-20 MED ORDER — NEOSTIGMINE METHYLSULFATE 10 MG/10ML IV SOLN
INTRAVENOUS | Status: DC | PRN
  Administered 2016-11-20: 3 mg via INTRAVENOUS

## 2016-11-20 MED ORDER — OXYCODONE HCL 5 MG PO TABS
ORAL_TABLET | ORAL | Status: AC
  Filled 2016-11-20: qty 1

## 2016-11-20 MED ORDER — PROPOFOL 10 MG/ML IV BOLUS
INTRAVENOUS | Status: DC | PRN
  Administered 2016-11-20: 150 mg via INTRAVENOUS

## 2016-11-20 MED ORDER — FENTANYL CITRATE (PF) 100 MCG/2ML IJ SOLN
25.0000 ug | INTRAMUSCULAR | Status: DC | PRN
  Administered 2016-11-20 (×2): 25 ug via INTRAVENOUS

## 2016-11-20 MED ORDER — LIDOCAINE HCL (CARDIAC) 20 MG/ML IV SOLN
INTRAVENOUS | Status: DC | PRN
  Administered 2016-11-20: 80 mg via INTRAVENOUS

## 2016-11-20 MED ORDER — FAMOTIDINE 20 MG PO TABS
ORAL_TABLET | ORAL | Status: AC
  Filled 2016-11-20: qty 1

## 2016-11-20 MED ORDER — MIDAZOLAM HCL 2 MG/2ML IJ SOLN
INTRAMUSCULAR | Status: DC | PRN
  Administered 2016-11-20: 2 mg via INTRAVENOUS

## 2016-11-20 MED ORDER — OXYCODONE HCL 5 MG/5ML PO SOLN: 5.0000 mg | Freq: Once | ORAL | Status: AC | PRN

## 2016-11-20 MED ORDER — LIDOCAINE HCL 1 % IJ SOLN
INTRAMUSCULAR | Status: DC | PRN
  Administered 2016-11-20: 8 mL

## 2016-11-20 MED ORDER — ROCURONIUM BROMIDE 100 MG/10ML IV SOLN
INTRAVENOUS | Status: DC | PRN
  Administered 2016-11-20: 40 mg via INTRAVENOUS
  Administered 2016-11-20: 10 mg via INTRAVENOUS

## 2016-11-20 SURGICAL SUPPLY — 44 items
BLADE SURG SZ11 CARB STEEL (BLADE) ×3 IMPLANT
CANISTER SUCT 1200ML W/VALVE (MISCELLANEOUS) ×3 IMPLANT
CATH ROBINSON RED A/P 16FR (CATHETERS) ×3 IMPLANT
CHLORAPREP W/TINT 26ML (MISCELLANEOUS) ×3 IMPLANT
DRESSING TELFA 4X3 1S ST N-ADH (GAUZE/BANDAGES/DRESSINGS) ×9 IMPLANT
DRSG TEGADERM 2-3/8X2-3/4 SM (GAUZE/BANDAGES/DRESSINGS) ×9 IMPLANT
DRSG TEGADERM 4X4.75 (GAUZE/BANDAGES/DRESSINGS) ×9 IMPLANT
DRSG TELFA 3X8 NADH (GAUZE/BANDAGES/DRESSINGS) ×3 IMPLANT
ELECT CAUTERY NEEDLE TIP 1.0 (MISCELLANEOUS) ×3
ELECTRODE CAUTERY NEDL TIP 1.0 (MISCELLANEOUS) ×2 IMPLANT
GLOVE BIO SURGEON STRL SZ8 (GLOVE) ×3 IMPLANT
GLOVE INDICATOR 8.0 STRL GRN (GLOVE) ×3 IMPLANT
GOWN STRL REUS W/ TWL LRG LVL3 (GOWN DISPOSABLE) ×2 IMPLANT
GOWN STRL REUS W/ TWL XL LVL3 (GOWN DISPOSABLE) ×2 IMPLANT
GOWN STRL REUS W/TWL LRG LVL3 (GOWN DISPOSABLE) ×1
GOWN STRL REUS W/TWL XL LVL3 (GOWN DISPOSABLE) ×1
IRRIGATION STRYKERFLOW (MISCELLANEOUS) IMPLANT
IRRIGATOR STRYKERFLOW (MISCELLANEOUS)
IV LACTATED RINGERS 1000ML (IV SOLUTION) ×3 IMPLANT
KIT PINK PAD W/HEAD ARE REST (MISCELLANEOUS) ×3
KIT PINK PAD W/HEAD ARM REST (MISCELLANEOUS) ×2 IMPLANT
KIT RM TURNOVER CYSTO AR (KITS) ×3 IMPLANT
LABEL OR SOLS (LABEL) IMPLANT
LIQUID BAND (GAUZE/BANDAGES/DRESSINGS) ×3 IMPLANT
NDL SAFETY ECLIPSE 18X1.5 (NEEDLE) ×2 IMPLANT
NEEDLE HYPO 18GX1.5 SHARP (NEEDLE) ×1
NS IRRIG 500ML POUR BTL (IV SOLUTION) ×3 IMPLANT
PACK DNC HYST (MISCELLANEOUS) ×3 IMPLANT
PACK GYN LAPAROSCOPIC (MISCELLANEOUS) ×3 IMPLANT
PAD OB MATERNITY 4.3X12.25 (PERSONAL CARE ITEMS) ×3 IMPLANT
PAD PREP 24X41 OB/GYN DISP (PERSONAL CARE ITEMS) ×3 IMPLANT
PENCIL ELECTRO HAND CTR (MISCELLANEOUS) ×3 IMPLANT
POUCH ENDO CATCH 10MM SPEC (MISCELLANEOUS) IMPLANT
SCISSORS METZENBAUM CVD 33 (INSTRUMENTS) IMPLANT
SLEEVE ENDOPATH XCEL 5M (ENDOMECHANICALS) ×6 IMPLANT
SPONGE XRAY 4X4 16PLY STRL (MISCELLANEOUS) ×3 IMPLANT
SUT MNCRL 4-0 (SUTURE) ×1
SUT MNCRL 4-0 27XMFL (SUTURE) ×2
SUT VIC AB 0 UR5 27 (SUTURE) ×3 IMPLANT
SUTURE MNCRL 4-0 27XMF (SUTURE) ×2 IMPLANT
SYR 30ML LL (SYRINGE) ×6 IMPLANT
TOWEL OR 17X26 4PK STRL BLUE (TOWEL DISPOSABLE) ×3 IMPLANT
TROCAR XCEL NON-BLD 5MMX100MML (ENDOMECHANICALS) ×3 IMPLANT
TUBING INSUFFLATOR HI FLOW (MISCELLANEOUS) ×3 IMPLANT

## 2016-11-20 NOTE — Transfer of Care (Signed)
Immediate Anesthesia Transfer of Care Note  Patient: Penny Ho  Procedure(s) Performed: Procedure(s): LAPAROSCOPY DIAGNOSTIC WITH BIOPSIES (N/A) INTRAUTERINE DEVICE (IUD) REMOVAL (N/A) INTRAUTERINE DEVICE (IUD) INSERTION (N/A) VULVAR BIOPSY  Patient Location: PACU  Anesthesia Type:General  Level of Consciousness: awake and patient cooperative  Airway & Oxygen Therapy: Patient Spontanous Breathing and Patient connected to nasal cannula oxygen  Post-op Assessment: Report given to RN and Post -op Vital signs reviewed and stable  Post vital signs: Reviewed and stable  Last Vitals:  Vitals:   11/20/16 1208  BP: 101/78  Pulse: (!) 103  Resp: 14  Temp: 36.2 C    Last Pain:  Vitals:   11/20/16 1208  TempSrc: Tympanic  PainSc: 0-No pain         Complications: No apparent anesthesia complications

## 2016-11-20 NOTE — Anesthesia Preprocedure Evaluation (Addendum)
Anesthesia Evaluation  Patient identified by MRN, date of birth, ID band Patient awake    Reviewed: Allergy & Precautions, NPO status , Patient's Chart, lab work & pertinent test results  History of Anesthesia Complications Negative for: history of anesthetic complications  Airway Mallampati: I  TM Distance: >3 FB Neck ROM: Full    Dental no notable dental hx.    Pulmonary asthma , neg sleep apnea,    breath sounds clear to auscultation- rhonchi (-) wheezing      Cardiovascular Exercise Tolerance: Good (-) hypertension(-) CAD and (-) Past MI  Rhythm:Regular Rate:Normal - Systolic murmurs and - Diastolic murmurs    Neuro/Psych Anxiety Depression    GI/Hepatic Neg liver ROS, GERD  ,Crohn's disease    Endo/Other  negative endocrine ROSneg diabetes  Renal/GU negative Renal ROS     Musculoskeletal negative musculoskeletal ROS (+)   Abdominal (+) - obese,   Peds  Hematology  (+) anemia ,   Anesthesia Other Findings Past Medical History: 12/01/2015: Absolute anemia 12/01/2015: Airway hyperreactivity No date: Anemia No date: Anxiety No date: Asthma 03/10/2013: Brain lesion 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 06/26/2013: Clinical depression No date: Crohn's disease (HCC) 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     Comment: Overview:  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  No date: Environmental allergies No date: Exercise-induced asthma No date: GERD (gastroesophageal reflux disease) 09/16/2013: H/o Lyme disease 09/17/2013: History of biliary T-tube placement No date: POTS (postural orthostatic tachycardia syndrom* No date: PTSD (post-traumatic stress disorder) No date: Vaso vagal episode     Comment: POTS-followed by [facility] No date: Vomiting and diarrhea   Reproductive/Obstetrics                            Anesthesia Physical Anesthesia Plan  ASA:  II  Anesthesia Plan: General   Post-op Pain Management:    Induction: Intravenous  Airway Management Planned: Oral ETT  Additional Equipment:   Intra-op Plan:   Post-operative Plan: Extubation in OR  Informed Consent: I have reviewed the patients History and Physical, chart, labs and discussed the procedure including the risks, benefits and alternatives for the proposed anesthesia with the patient or authorized representative who has indicated his/her understanding and acceptance.   Dental advisory given  Plan Discussed with: CRNA and Anesthesiologist  Anesthesia Plan Comments:         Anesthesia Quick Evaluation

## 2016-11-20 NOTE — Anesthesia Procedure Notes (Signed)
Procedure Name: Intubation Date/Time: 11/20/2016 1:32 PM Performed by: Rosaria Ferries, Jarious Lyon Pre-anesthesia Checklist: Patient identified, Emergency Drugs available, Suction available and Patient being monitored Patient Re-evaluated:Patient Re-evaluated prior to inductionOxygen Delivery Method: Circle system utilized Preoxygenation: Pre-oxygenation with 100% oxygen Intubation Type: IV induction Laryngoscope Size: Mac and 3 Grade View: Grade I Tube size: 7.0 mm Number of attempts: 1 Airway Equipment and Method: LTA kit utilized Placement Confirmation: ETT inserted through vocal cords under direct vision,  positive ETCO2 and breath sounds checked- equal and bilateral Secured at: 21 cm Tube secured with: Tape Dental Injury: Teeth and Oropharynx as per pre-operative assessment

## 2016-11-20 NOTE — OR Nursing (Signed)
Penny Ho IUD was explanted and placed on back table to be discarded per MD.

## 2016-11-20 NOTE — Interval H&P Note (Signed)
History and Physical Interval Note:  11/20/2016 12:50 PM  Penny Ho  has presented today for surgery, with the diagnosis of AUCTE VULVITIS, PELVIC PAIN, DYSPAREUNIA, CROHNS DISEASE  The various methods of treatment have been discussed with the patient and family. After consideration of risks, benefits and other options for treatment, the patient has consented to  Procedure(s): LAPAROSCOPY DIAGNOSTIC WITH BIOPSIES (N/A) INTRAUTERINE DEVICE (IUD) REMOVAL (N/A) INTRAUTERINE DEVICE (IUD) INSERTION (N/A) VULVAR BIOPSY as a surgical intervention .  The patient's history has been reviewed, patient examined, no change in status, stable for surgery.  I have reviewed the patient's chart and labs.  Questions were answered to the patient's satisfaction.     Daphine DeutscherMartin A Trea Latner

## 2016-11-20 NOTE — H&P (View-Only) (Signed)
Subjective:  PREOPERATIVE HISTORY AND PHYSICAL     Patient is a 20 y.o. G0P0036female scheduled for Surgery on 11/20/2016.  Planned procedures: 1. Laparoscopy with peritoneal biopsies 2. Vulvar biopsy 3. Removal and reinsertion of IUD   Chronic vulvitis: Patient has not had any improvement in symptomatology with utilizing metronidazole as well as nystatin/triamcinolone cream topically to the vulva. She continues to have significant itching and burning and pain.Clinical exam is notable for moderate to severe vulvar hyperemia located in the perianal posterior fourchette region; possible flat wart at 10:00 in relation to the anus; to be biopsied in same day surgery  Encounter for IUD management: Patient was to have Skyla removed and new Skyla reinserted. However, upon further history taking, it appears that the patient may have endometriosis. If so, a better management plan would be to confirm the diagnosis at laparoscopy, and then insert a Mirena for better treatment of this condition. She is willing to proceed with laparoscopy with biopsies and prefers to have a Mirena inserted at the time of this outpatient surgery.  Chronic pelvic pain: Patient continues to have significant pelvic pain in the form of low back pain with radiation into the buttocks and thighs during menses (when they occur). She continues to experience deep thrusting dyspareunia to a point where she does not wish to engage in relations. Laparoscopy is to be performed to rule out endometriosis.    Pertinent Gynecological History: No LMP recorded. Patient is not currently having periods (Reason: IUD). Contraception: IUD Last Pap: Never Last mammogram: N/A   OB History    Gravida Para Term Preterm AB Living   0 0 0 0 0 0   SAB TAB Ectopic Multiple Live Births   0 0 0 0 0      Menarche age: NA No LMP recorded (lmp unknown). Patient is not currently having periods (Reason: IUD).    Past Medical History:  Diagnosis  Date  . Anemia   . Asthma   . Crohn's disease (HCC)   . Environmental allergies   . Exercise-induced asthma   . PTSD (post-traumatic stress disorder)   . Vaso vagal episode    POTS-followed by [facility]  . Vomiting and diarrhea     Past Surgical History:  Procedure Laterality Date  . INTRAUTERINE DEVICE INSERTION      OB History  Gravida Para Term Preterm AB Living  0 0 0 0 0 0  SAB TAB Ectopic Multiple Live Births  0 0 0 0 0        Social History   Social History  . Marital status: Single    Spouse name: N/A  . Number of children: N/A  . Years of education: N/A   Social History Main Topics  . Smoking status: Never Smoker  . Smokeless tobacco: Never Used  . Alcohol use 0.0 oz/week     Comment: occasional  . Drug use: No     Comment: Marijuana occ-last June  . Sexual activity: Not Currently    Partners: Male    Birth control/ protection: IUD   Other Topics Concern  . None   Social History Narrative  . None    Family History  Problem Relation Age of Onset  . Cancer Paternal Grandmother   . Cancer Paternal Grandfather   . Colon cancer Neg Hx   . Crohn's disease Neg Hx      (Not in a hospital admission)  Allergies  Allergen Reactions  . Other Other (See Comments)  PT. STATES SHE HAS BEEN ADVISED TO AVOID ANTIBIOTICS DUE TO C. DIFF CAUSED BY ANTIBIOTIC USE  . Pistachio Nut (Diagnostic) Anaphylaxis    TREE NUT ALLERGY (almonds, [place] nuts, cashews, chestnuts, filberts/hazelnuts, macadamia nuts, pecans, pistachios, pine nuts, shea nuts,walnuts)    Review of Systems Constitutional: No recent fever/chills/sweats Respiratory: No recent cough/bronchitis Cardiovascular: No chest pain Gastrointestinal: No recent nausea/vomiting/diarrhea Genitourinary: No UTI symptoms Hematologic/lymphatic:No history of coagulopathy or recent blood thinner use    Objective:    BP (!) 88/54   Pulse (!) 103   Ht 5\' 2"  (1.575 m)   Wt 132 lb 3.2 oz (60 kg)   LMP   (LMP Unknown)   BMI 24.18 kg/m   General:   Normal  Skin:   normal  HEENT:  Normal  Neck:  Supple without Adenopathy or Thyromegaly  Lungs:   Heart:              Breasts:   Abdomen:  Pelvis:  M/S   Extremeties:  Neuro:    clear to auscultation bilaterally   Normal without murmur   Not Examined   soft, non-tender; bowel sounds normal; no masses,  no organomegaly   Exam deferred to OR  No CVAT  Warm/Dry   Normal        10/04/2016 PELVIC:             External Genitalia: Moderate to severe vulvar hyperemia located in the perianal posterior fourchette region; possible flat wart at 10:00 in relation to anus             BUS: Normal             Vagina: Normal; white secretions in vault (patient used [employer] yesterday)             Cervix: Normal; no Saha lesions; IUD string not visualized             Uterus: Normal size, shape,consistency, mobile, nontender             Adnexa: Normal             RV: Normal              Bladder: Nontender  Assessment:    1. Chronic pelvic pain/dysmenorrhea 2. Chronic vulvitis 3. Contraceptive management (IUD removal and reinsertion)   Plan:  1. Laparoscopy with peritoneal biopsies 2. Vulvar biopsy 3. IUD removal and Mirena insertion  Preop counseling: The patient is to undergo laparoscopy with peritoneal biopsies, IUD removal and IUD reinsertion, and vulvar biopsy on 11/20/2016. She is understanding of the planned procedures and is aware of and is accepting of all surgical risks which include but are not limited to bleeding, infection, pelvic organ injury with need for repair, blood clot disorders, anesthesia risks, etc. All questions have been answered. Informed consent is given. Patient is ready and willing to proceed with surgery as scheduled.  Herold HarmsMartin A Timofey Carandang, MD  Note: This dictation was prepared with Dragon dictation along with smaller phrase technology. Any transcriptional errors that result from this process are  unintentional.

## 2016-11-20 NOTE — Anesthesia Postprocedure Evaluation (Signed)
Anesthesia Post Note  Patient: Penny Ho  Procedure(s) Performed: Procedure(s) (LRB): LAPAROSCOPY DIAGNOSTIC WITH BIOPSIES (N/A) INTRAUTERINE DEVICE (IUD) REMOVAL (N/A) INTRAUTERINE DEVICE (IUD) INSERTION (N/A) VULVAR BIOPSY  Patient location during evaluation: PACU Anesthesia Type: General Level of consciousness: awake and alert Pain management: pain level controlled Vital Signs Assessment: post-procedure vital signs reviewed and stable Respiratory status: spontaneous breathing and respiratory function stable Cardiovascular status: stable Anesthetic complications: no    Last Vitals:  Vitals:   11/20/16 1208 11/20/16 1523  BP: 101/78 109/76  Pulse: (!) 103 100  Resp: 14 (!) 6  Temp: 36.2 C 36.3 C    Last Pain:  Vitals:   11/20/16 1208  TempSrc: Tympanic  PainSc: 0-No pain                 KEPHART,WILLIAM K

## 2016-11-20 NOTE — Op Note (Signed)
OPERATIVE NOTE:  Penny Ho PROCEDURE DATE: 11/20/2016   PREOPERATIVE DIAGNOSIS:  1. Chronic pelvic pain (dysmenorrhea; dyspareunia) 2. Chronic vulvitis 3. Contraceptive management  POSTOPERATIVE DIAGNOSIS:  1. Chronic pelvic pain (dysmenorrhea; dyspareunia) 2. Chronic vulvitis 3. Contraceptive management  PROCEDURE:  1. Laparoscopy with peritoneal biopsies 2. IUD removal and Mirena IUD insertion 3. Vulvar biopsy 2   SURGEON:  Herold HarmsMartin A Jacqueli Pangallo, MD ASSISTANTS: None ANESTHESIA: General INDICATIONS: 20 y.o. G0P0000 with Skyla IUD in place, requiring replacement, presents for evaluation of chronic pelvic pain in the form of severe dysmenorrhea and deep thrusting dyspareunia. She also has chronic vulvitis that has been refractory to conservative medical therapy and abnormalities on exam which are requiring biopsy  FINDINGS:   1. Laparoscopy demonstrated grossly normal-appearing uterus, fallopian tubes, and ovaries. Peritoneal surfaces were suspicious for white lesions and clear lesions located in the left and right ovarian fossa, left pelvic sidewall, and cul-de-sac. Bladder flap also demonstrated adhesions as well as clear lesions suspicious for endometriosis 2. Peritoneum demonstrated bilateral symmetric vulvar hyperemia without satellite lesions, ulcerations, or epithelial skin breakdown. A 1 cm x 0.5 cm flat work is noted in the perianal region.   I/O's: Total I/O In: 1000 [I.V.:1000] Out: 200 [Urine:200] COUNTS:  YES SPECIMENS:  Multiple peritoneal biopsies:  Left ovarian fossa  Left pelvic sidewall  Cul-de-sac  Bladder flap  Right uterosacral ligament  Right ovarian fossa Right labia majora biopsy Right perianal biopsy  ANTIBIOTIC PROPHYLAXIS:N/A COMPLICATIONS: None immediate  PROCEDURE IN DETAIL: Patient was brought to the operating room and placed in the supine position. General endotracheal anesthesia was induced without difficulty. She was placed in the  dorsal lithotomy position using the bumblebee stirrups. A ChloraPrep abdominal perineal intravaginal prep and drape was performed in standard fashion. Timeout was completed. Red Robinson catheter was used to to remove 200 mL of urine from the bladder. A Graves' speculum was placed into the vagina. IUD REMOVAL: Single-tooth tenaculum was placed on the anterior lip of the cervix. The Skyla IUD string could not be visualized; [place] forceps was then used along with a serrated curette to identify and grasp the Skyla IUD which was subsequently removed. Hulka tenaculum was placed onto the cervix to facilitate uterine manipulation LAPAROSCOPY: Laparoscopy was performed in standard fashion. A subumbilical vertical incision 5 mm in length was made. The Optiview laparoscopic trocar system was used to place a 5 mm port directly into the abdominal pelvic cavity without evidence of bowel or vascular injury. 2 other 5 mm ports were placed in the lower abdomen under direct visualization. The above-noted findings were photo documented. Representative biopsies were taken with biopsy forceps. Good hemostasis was noted at the biopsy sites. The pelvis and biopsy sites were irrigated, and irrigant fluid was aspirated. Following completion of survey of the pelvis and abdomen, the procedure was terminated with the laparoscope being removed from the abdominal pelvic cavity. Pneumoperitoneum was released. The incisions were closed with 4-0 Monocryl suture in a simple interrupted manner. Dermabond glue was placed over the incisions. IUD INSERTION:  : (Lot number-[id number]; expiration-04/20) Graves' speculum was placed into the vagina. The Hulka tenaculum was removed. Single-tooth tenaculum was placed on the anterior lip of the cervix. Sterile gloves were placed. The uterus was sounded to 7 cm with the sound. The Mirena IUD was inserted without difficulty. The IUD string was trimmed at 3 cm. VULVAR BIOPSIES: The right labia majora and  right perianal biopsy sites are infiltrated with 3 cc of lidocaine without epinephrine. 3.5 mm  punch biopsy is taken of the right labia majora. 2 4-0 Monocryl simple interrupted sutures are placed for hemostasis. The right perianal biopsy was performed with a 15 blade scalpel. Bovie cautery was used to provide hemostasis in the subcutaneous tissues. 2 simple interrupted sutures of 4-0 Monocryl were placed for hemostasis. Following completion of her procedures, patient was awakened, extubated, and taken the recovery room in satisfactory condition.  Penny Ho A. Beatris Si, MD, [facility]

## 2016-11-20 NOTE — Discharge Instructions (Signed)

## 2016-11-21 ENCOUNTER — Encounter: Payer: Self-pay | Admitting: Obstetrics and Gynecology

## 2016-11-22 LAB — SURGICAL PATHOLOGY

## 2016-11-28 ENCOUNTER — Encounter: Payer: Self-pay | Admitting: Gastroenterology

## 2016-11-28 ENCOUNTER — Ambulatory Visit (INDEPENDENT_AMBULATORY_CARE_PROVIDER_SITE_OTHER): Payer: PRIVATE HEALTH INSURANCE | Admitting: Gastroenterology

## 2016-11-28 VITALS — BP 91/59 | HR 93 | Temp 98.1°F | Ht 62.0 in | Wt 128.8 lb

## 2016-11-28 DIAGNOSIS — K50019 Crohn's disease of small intestine with unspecified complications: Secondary | ICD-10-CM | POA: Diagnosis not present

## 2016-11-28 MED ORDER — FERROUS SULFATE 325 (65 FE) MG PO TABS: 325.0000 mg | ORAL_TABLET | Freq: Two times a day (BID) | ORAL | 0 refills | Status: DC

## 2016-11-28 NOTE — Progress Notes (Signed)
Primary Care Physician: Noralee Stain, MD  Primary Gastroenterologist:  Dr. [name]   Chief Complaint  Patient presents with  . Follow up Crohn's disease    HPI: Penny Ho is a 20 y.o. female   to follow up to her last office visit on 11/14/2016 for crohns disease .    Summary of history :  Diagnosis and date: Dec 2014, diagnosed here , unsure area affected.  Prior surgeries: None  Present medication: Humira, every other week since 02/2014 , last flare she states presently , prior to this says been getting worse over the past few months. She had adequate drug levels in 2015 and low antibody titre. Never been on Imuran or methotrexate   Last MRE: 11/17/2017long segment of diseased distal and termonal ileum similar to CT in 2015 .  Last EGD/Colonoscopy: Last colonoscopy 11/2013- ?ileal disease and sigmoid disease- commenced on pentasa and entocort Smoking status: never  Use of NSAID's No Last use of steroids: 11/2015 -prednisone for 2 weeks Chronic steroid use : no  Last DEXA scan : no  Hep B/A,HIV: immunization status: immune and negative HIV ab in 10/04/16.   Last Tdap, pneumonia and flu shot: Unsure about tdap but thinks she has had recently .  Vitamin D deficiency :  B12 deficiency : Not checked   TPMT and TB statis: TB quantiferon 10/25/16- negative  . Normal TPMT enzyme activity.  Celiac serology: negative  ADA antibody negative and adequate levels of adalimumab 10/26/16   Interval history 11/14/2016-11/28/16  Iron studies show she has a low iron . She has started the Entocort , thinks it is helping . She just had surgery to evaluate for endometriosis. Diarrhea is better. Has abdominal pain , unsure if its from the incision due to surgery or from colitis.  Having some diarrhea. Some abdominal pain on and off . No NSAID's    Current Outpatient Prescriptions  Medication Sig Dispense Refill  . albuterol (PROVENTIL HFA;VENTOLIN HFA) 108 (90 Base)  MCG/ACT inhaler Inhale 1-2 puffs into the lungs every 6 (six) hours as needed for wheezing or shortness of breath.    . budesonide (ENTOCORT EC) 3 MG 24 hr capsule Take 3 capsules (9 mg total) by mouth daily. 183 capsule 0  . gentamicin ointment (GARAMYCIN) 0.1 % APPLY TO PAINFUL EAR ONCE TO TWICE DAILY AS NEEDED FOR PAIN.  8  . ibuprofen (ADVIL,MOTRIN) 800 MG tablet Take 1 tablet (800 mg total) by mouth 3 (three) times daily. 30 tablet 1  . olopatadine (PATANOL) 0.1 % ophthalmic solution APPLY 1 DROP INTO BOTH EYES TWICE DAILY AS NEEDED FOR ALLERGY EYES  3  . sertraline (ZOLOFT) 100 MG tablet Take 50 mg by mouth daily.   5  . topiramate (TOPAMAX) 50 MG tablet 1 TABLET BY MOUTH TWICE A DAY  0  . oxyCODONE-acetaminophen (ROXICET) 5-325 MG tablet Take 1-2 tablets by mouth every 4 (four) hours as needed for moderate pain or severe pain. (Patient not taking: Reported on 11/28/2016) 30 tablet 0   No current facility-administered medications for this visit.     Allergies as of 11/28/2016 - Review Complete 11/28/2016  Allergen Reaction Noted  . Other Other (See Comments) 12/01/2015  . 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 ENT: Negative for hoarseness, difficulty swallowing , nasal congestion. CV: Negative for chest pain, angina, palpitations, dyspnea on exertion, peripheral edema.  Respiratory: Negative for dyspnea at rest,  dyspnea on exertion, cough, sputum, wheezing.  GI: See history of present illness. GU:  Negative for dysuria, hematuria, urinary incontinence, urinary frequency, nocturnal urination.  Endo: Negative for unusual weight change.    Physical Examination:   BP (!) 91/59   Pulse 93   Temp 98.1 F (36.7 C) (Oral)   Ht 5\' 2"  (1.575 m)   Wt 128 lb 12.8 oz (58.4 kg)   LMP  (LMP Unknown)   BMI 23.56 kg/m   General: Well-nourished, well-developed in no acute distress.  Eyes:  No icterus. Conjunctivae pink. Mouth: Oropharyngeal mucosa moist and pink , no lesions erythema or exudate. Lungs: Clear to auscultation bilaterally. Non-labored. Heart: Regular rate and rhythm, no murmurs rubs or gallops.  Abdomen: Mild generalized tenderness particularly around incision sites.  Bowel sounds are normal, nondistended, no hepatosplenomegaly or masses, no abdominal bruits or hernia , no rebound or guarding.   Extremities: No lower extremity edema. No clubbing or deformities. Neuro: Alert and oriented x 3.  Grossly intact. Skin: Warm and dry, no jaundice.   Psych: Alert and cooperative, normal mood and affect.  Imaging Studies: Mr [name] Abdomen W Wo Contrast  Result Date: 11/03/2016 CLINICAL DATA:  20 year old with history of Crohn's disease with abdominal pain and cramping. EXAM: MR ABDOMEN AND PELVIS WITHOUT AND WITH CONTRAST (MR ENTEROGRAPHY) TECHNIQUE: Multiplanar, multisequence MRI of the abdomen and pelvis was performed both before and during bolus administration of intravenous contrast. Negative oral contrast VoLumen was given. CONTRAST:  12mL MULTIHANCE GADOBENATE DIMEGLUMINE 529 MG/ML IV SOLN COMPARISON:  CT scan 04/06/2014 FINDINGS: COMBINED FINDINGS FOR BOTH MR ABDOMEN AND PELVIS Lower chest: The lung bases are clear. No pulmonary nodules, pleural or pericardial effusion. Hepatobiliary: No worrisome hepatic lesions. No intrahepatic biliary dilatation. The portal and hepatic veins are patent. The gallbladder is normal. No common bile duct dilatation. Pancreas:  No mass, inflammation or ductal dilatation. Spleen:  Normal size.  No focal lesions. Adrenals/Urinary Tract:  The adrenal glands and kidneys are normal. Stomach/Bowel: The stomach, duodenum and proximal small bowel are unremarkable. No inflammatory changes or obstructive findings. There is a long segment of inflammatory bowel disease involving the distal and terminal ileum which looks very similar to the prior CT scan  2015. There is marked mucosal and serosal enhancement and submucosal edema. No abscess or fistula. No colonic involvement. Vascular/Lymphatic: The aorta and branch vessels are normal. The major venous structures are normal. No mesenteric or retroperitoneal mass or adenopathy. Reproductive: The uterus and ovaries are normal. Other: No pelvic mass or adenopathy. No free pelvic fluid collections. No inguinal mass or adenopathy. Musculoskeletal: No significant bony findings. IMPRESSION: 1. Long segment of Crohn's disease involving the distal and terminal ileum. No abscess, fistula or obstruction. No colonic involvement. 2. No other significant abdominal/pelvic findings. Electronically Signed   By: Rudie MeyerP.  Gallerani M.D.   On: 11/03/2016 15:45   Mr [name] Pelvis W WUWo Contrast  Result Date: 11/03/2016 CLINICAL DATA:  20 year old with history of Crohn's disease with abdominal pain and cramping. EXAM: MR ABDOMEN AND PELVIS WITHOUT AND WITH CONTRAST (MR ENTEROGRAPHY) TECHNIQUE: Multiplanar, multisequence MRI of the abdomen and pelvis was performed both before and during bolus administration of intravenous contrast. Negative oral contrast VoLumen was given. CONTRAST:  12mL MULTIHANCE GADOBENATE DIMEGLUMINE 529 MG/ML IV SOLN COMPARISON:  CT scan 04/06/2014 FINDINGS: COMBINED FINDINGS FOR BOTH MR ABDOMEN AND PELVIS Lower chest: The lung bases are clear. No pulmonary nodules, pleural or pericardial effusion. Hepatobiliary: No worrisome hepatic lesions. No intrahepatic  biliary dilatation. The portal and hepatic veins are patent. The gallbladder is normal. No common bile duct dilatation. Pancreas:  No mass, inflammation or ductal dilatation. Spleen:  Normal size.  No focal lesions. Adrenals/Urinary Tract:  The adrenal glands and kidneys are normal. Stomach/Bowel: The stomach, duodenum and proximal small bowel are unremarkable. No inflammatory changes or obstructive findings. There is a long segment of inflammatory bowel disease  involving the distal and terminal ileum which looks very similar to the prior CT scan 2015. There is marked mucosal and serosal enhancement and submucosal edema. No abscess or fistula. No colonic involvement. Vascular/Lymphatic: The aorta and branch vessels are normal. The major venous structures are normal. No mesenteric or retroperitoneal mass or adenopathy. Reproductive: The uterus and ovaries are normal. Other: No pelvic mass or adenopathy. No free pelvic fluid collections. No inguinal mass or adenopathy. Musculoskeletal: No significant bony findings. IMPRESSION: 1. Long segment of Crohn's disease involving the distal and terminal ileum. No abscess, fistula or obstruction. No colonic involvement. 2. No other significant abdominal/pelvic findings. Electronically Signed   By: Rudie Meyer M.D.   On: 11/03/2016 15:45    Assessment and Plan:   Penny Ho is a 20 y.o. y/o female with a history of crohns ileo-colitis on Humira not responding. I plan to start her on Stelara which is a different class of medication compared to Humira which has stopped working for her. I will hold off starting the medication for another 4 weeks since she just had surgery for endometriosis and still pretty tender around the incision sites. The stelara is an immunosuppressant and can increase risk of infections. In 4 weeks if her pain at he incisions has resolved then will start. In the meanwhile she will continue her entocort. She is also iron deficient and will start her on oral iron.   Follow up in 4 weeks.   Dr [name]  MD

## 2016-11-29 ENCOUNTER — Encounter: Payer: Self-pay | Admitting: Obstetrics and Gynecology

## 2016-11-29 ENCOUNTER — Ambulatory Visit (INDEPENDENT_AMBULATORY_CARE_PROVIDER_SITE_OTHER): Payer: PRIVATE HEALTH INSURANCE | Admitting: Obstetrics and Gynecology

## 2016-11-29 VITALS — BP 104/62 | HR 121 | Ht 62.0 in | Wt 127.5 lb

## 2016-11-29 DIAGNOSIS — Q796 Ehlers-Danlos syndrome, unspecified: Secondary | ICD-10-CM

## 2016-11-29 DIAGNOSIS — N809 Endometriosis, unspecified: Secondary | ICD-10-CM

## 2016-11-29 DIAGNOSIS — Z9889 Other specified postprocedural states: Secondary | ICD-10-CM

## 2016-11-29 DIAGNOSIS — N763 Subacute and chronic vulvitis: Secondary | ICD-10-CM

## 2016-11-29 DIAGNOSIS — Z09 Encounter for follow-up examination after completed treatment for conditions other than malignant neoplasm: Secondary | ICD-10-CM

## 2016-11-29 MED ORDER — CLOBETASOL PROPIONATE 0.05 % EX OINT: 1.0000 "application " | TOPICAL_OINTMENT | Freq: Two times a day (BID) | CUTANEOUS | 0 refills | Status: AC

## 2016-11-29 NOTE — Progress Notes (Signed)
Chief complaint: 1. One week postop check 2. Status post laparoscopy with peritoneal biopsies, IUD removal and Mirena IUD insertion, and vulvar biopsy  Patient reports doing reasonably well post surgery. Bowel and bladder function is normal. Skin incisions from laparoscopy are still uncomfortable. Pathology is reviewed with the patient and management discussed.  PATHOLOGY: SPECIMEN SUBMITTED:  A. Pelvic side wall, left; biopsy  B. Ovarian fossa, left  C. Cul de sac; biopsy  D. Bladder flap; biopsy  E. Uterosacral ligament, right  F. Ovarian fossa, right  G. Perineal, right; biopsy  H. Labia majora, right   DIAGNOSIS:  A. PELVIC SIDEWALL, LEFT; BIOPSY:  - FIBROADIPOSE TISSUE WITH CHRONIC INFLAMMATION.   B. LEFT OVARIAN FOSSA; BIOPSY:  - ENDOMETRIOSIS.   C. CUL-DE-SAC; BIOPSY:  - MESOTHELIAL LINED FIBROADIPOSE TISSUE WITH CHRONIC INFLAMMATION AND  FOCAL CALCIFICATION.   D. BLADDER FLAP; BIOPSY:  - MESOTHELIAL LINED FIBROMUSCULAR TISSUE.   E. UTEROSACRAL LIGAMENT, RIGHT; BIOPSY:  - MESOTHELIAL LINED FIBROADIPOSE TISSUE WITH CHRONIC INFLAMMATION AND  FOCAL CALCIFICATION.   F. RIGHT OVARIAN FOSSA; BIOPSY:  - MESOTHELIAL LINED FIBROADIPOSE TISSUE WITH CHRONIC INFLAMMATION.   G. PERINEUM, RIGHT; BIOPSY:  - INFLAMED SEBORRHEIC KERATOSIS-LIKE LESION.   H. LABIA MAJORA, RIGHT; BIOPSY:  - PARAKERATOSIS, ACANTHOSIS AND CHRONIC INFLAMMATION.  - NEGATIVE FOR DYSPLASIA AND MALIGNANCY.    OBJECTIVE: BP 104/62   Pulse (!) 121   Ht 5\' 2"  (1.575 m)   Wt 127 lb 8 oz (57.8 kg)   LMP  (LMP Unknown)   BMI 23.32 kg/m  Physical exam: Abdomen: laparoscopy incisions healing well, covered with Dermabond without evidence of inflammation, induration, or drainage Vulva: Biopsy sites well approximated without induration, inflammation, or drainage  ASSESSMENT: 1. One week status post laparoscopy with peritoneal biopsies, Mirena insertion following IUD removal, and vulvar biopsy, doing  well 2. Endometriosis confirmed as etiology of pelvic pain 3. Vulvar biopsies with chronic inflammation without evidence of lichen sclerosus or VIN.  PLAN: 1. Continue with local postop wound care 2. Begin Temovate ointment 0.05% topically to the vulva twice a day for 2 weeks followed by once a for 4 weeks 3. Return in 6 weeks for follow-up 4. Monitor pelvic pain for changes since Mirena IUD has been inserted.Herold HarmsMartin A Miri Jose, MD  Note: This dictation was prepared with Dragon dictation along with smaller phrase technology. Any transcriptional errors that result from this process are unintentional.

## 2016-11-29 NOTE — Patient Instructions (Signed)
Temovate 2 a day for 2 weeks, then daily for 4 weeks Follow up in 6 weeks

## 2016-12-15 ENCOUNTER — Telehealth: Payer: Self-pay

## 2016-12-15 NOTE — Telephone Encounter (Signed)
Patient has Chron's and now has the flu. She is wondering if there is anything she needs to be doing differently. Please call patient and advice.

## 2016-12-15 NOTE — Telephone Encounter (Signed)
Spoke with pt regarding the flu. Advised her there is nothing really she needs to be doing other than drinking plenty of fluids and avoid NSAID's as this may cause her to have diarrhea. She can take Tylenol as needed for fever or chills. Pt is to contact if her crohn's starts to flare.

## 2016-12-18 ENCOUNTER — Ambulatory Visit
Admission: EM | Admit: 2016-12-18 | Discharge: 2016-12-18 | Disposition: A | Discharge status: Home / Self Care | Payer: 59 | Attending: Emergency Medicine | Admitting: Emergency Medicine

## 2016-12-18 DIAGNOSIS — H60502 Unspecified acute noninfective otitis externa, left ear: Secondary | ICD-10-CM | POA: Diagnosis not present

## 2016-12-18 DIAGNOSIS — H65192 Other acute nonsuppurative otitis media, left ear: Secondary | ICD-10-CM

## 2016-12-18 LAB — RAPID INFLUENZA A&B ANTIGENS
Influenza A (ARMC): NEGATIVE
Influenza B (ARMC): NEGATIVE

## 2016-12-18 MED ORDER — AZITHROMYCIN 250 MG PO TABS: 250.0000 mg | ORAL_TABLET | Freq: Every day | ORAL | 0 refills | Status: DC

## 2016-12-18 NOTE — ED Triage Notes (Signed)
Patient complains of body aches, fever, dizziness, fatigue, vomiting (3-4 episodes daily). Patient states that she was sick on Christmas and slept 15 hours and was better. Patient states that symptoms returned 5 days ago and have been constant.

## 2016-12-18 NOTE — ED Provider Notes (Signed)
CSN: [id number]     Arrival date & time 12/18/16  1415 History   None    Chief Complaint  Patient presents with  . Fever   (Consider location/radiation/quality/duration/timing/severity/associated sxs/prior Treatment) Patient is a 21 year old female, student in [employer],  with fairly complicated medical histories, presents today for 5 day duration of flulike symptoms. Symptoms consist of body aches, coughing, fever, chills, fatigue and decreased appetite. Patient also reports frequent ear infections in the past and believe that she has another ear infection today.  Patient endorses left ear pain onset couple days ago without hearing loss, drainage, or tinnitus. She also reports positive for sore throat, congestion and sinus pressure.    The history is provided by the patient.    Past Medical History:  Diagnosis Date  . Absolute anemia 12/01/2015  . Airway hyperreactivity 12/01/2015  . Anemia   . Anxiety   . Asthma   . Brain lesion 03/10/2013  . 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 Clinical depression 06/26/2013  . Crohn's disease (HCC)   . 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 Environmental allergies   . Exercise-induced asthma   . GERD (gastroesophageal reflux disease)   . H/o Lyme disease 09/16/2013  . History of biliary T-tube placement 09/17/2013  . POTS (postural orthostatic tachycardia syndrome)   . PTSD (post-traumatic stress disorder)   . Vaso vagal episode    POTS-followed by [facility]  . Vomiting and diarrhea    Past Surgical History:  Procedure Laterality Date  . BILIARY TUBE PLACEMENT ([facility] HX)  09/17/2013  . INTRAUTERINE DEVICE (IUD) INSERTION N/A 11/20/2016   Procedure: INTRAUTERINE DEVICE (IUD) INSERTION;  Surgeon: Herold Harms, MD;  Location: [facility] ORS;  Service: Gynecology;  Laterality: N/A;  . INTRAUTERINE DEVICE INSERTION    . IUD REMOVAL N/A 11/20/2016   Procedure: INTRAUTERINE DEVICE  (IUD) REMOVAL;  Surgeon: Herold Harms, MD;  Location: [facility] ORS;  Service: Gynecology;  Laterality: N/A;  . LAPAROSCOPY N/A 11/20/2016   Procedure: LAPAROSCOPY DIAGNOSTIC WITH BIOPSIES;  Surgeon: Herold Harms, MD;  Location: [facility] ORS;  Service: Gynecology;  Laterality: N/A;  . VULVA Ples Specter BIOPSY  11/20/2016   Procedure: VULVAR BIOPSY;  Surgeon: Herold Harms, MD;  Location: [facility] ORS;  Service: Gynecology;;  . WISDOM TOOTH EXTRACTION     Family History  Problem Relation Age of Onset  . Healthy Mother   . Healthy Father   . Cancer Paternal Grandmother   . Cancer Paternal Grandfather   . Colon cancer Neg Hx   . Crohn's disease Neg Hx    Social History  Substance Use Topics  . Smoking status: Never Smoker  . Smokeless tobacco: Never Used  . Alcohol use No     Comment: occasional   OB History    Gravida Para Term Preterm AB Living   0 0 0 0 0 0   SAB TAB Ectopic Multiple Live Births   0 0 0 0 0     Review of Systems  Constitutional: Positive for appetite change, chills, fatigue and fever.  HENT: Positive for congestion, ear pain, sinus pain, sinus pressure and sneezing.   Respiratory: Positive for cough. Negative for shortness of breath.   Cardiovascular: Negative for chest pain and palpitations.  Gastrointestinal: Negative for abdominal pain, nausea and vomiting.  Musculoskeletal: Positive for myalgias.  Neurological: Negative for dizziness and headaches.    Allergies  Other and Pistachio  nut (diagnostic)  Home Medications   Prior to Admission medications   Medication Sig Start Date End Date Taking? Authorizing Provider  budesonide (ENTOCORT EC) 3 MG 24 hr capsule Take 3 capsules (9 mg total) by mouth daily. 11/14/16 01/14/17 Yes [name], MD  clobetasol ointment (TEMOVATE) 0.05 % Apply 1 application topically 2 (two) times daily. 11/29/16  Yes Prentice Docker Defrancesco, MD  dicyclomine (BENTYL) 20 MG tablet Take 20 mg by mouth every 6 (six) hours.    Yes Historical Provider, MD  ferrous sulfate 325 (65 FE) MG tablet Take 1 tablet (325 mg total) by mouth 2 (two) times daily with a meal. 11/28/16 01/29/17 Yes [name], MD  gentamicin ointment (GARAMYCIN) 0.1 % APPLY TO PAINFUL EAR ONCE TO TWICE DAILY AS NEEDED FOR PAIN. 10/06/16  Yes Historical Provider, MD  ibuprofen (ADVIL,MOTRIN) 800 MG tablet Take 1 tablet (800 mg total) by mouth 3 (three) times daily. 11/20/16  Yes Prentice Docker Defrancesco, MD  olopatadine (PATANOL) 0.1 % ophthalmic solution APPLY 1 DROP INTO BOTH EYES TWICE DAILY AS NEEDED FOR ALLERGY EYES 09/21/16  Yes Historical Provider, MD  Probiotic Product (PROBIOTIC ADVANCED PO) Take by mouth.   Yes Historical Provider, MD  sertraline (ZOLOFT) 100 MG tablet Take 50 mg by mouth daily.  05/27/15  Yes Historical Provider, MD  albuterol (PROVENTIL HFA;VENTOLIN HFA) 108 (90 Base) MCG/ACT inhaler Inhale 1-2 puffs into the lungs every 6 (six) hours as needed for wheezing or shortness of breath.    Historical Provider, MD   Meds Ordered and Administered this Visit  Medications - No data to display  BP 105/69 (BP Location: Left Arm)   Pulse (!) 113   Temp 99.5 F (37.5 C) (Oral)   Resp 17   Ht 5\' 2"  (1.575 m)   Wt 135 lb (61.2 kg)   SpO2 98%   BMI 24.69 kg/m  No data found.   Physical Exam  Constitutional: She is oriented to person, place, and time. She appears well-developed and well-nourished.  HENT:  Head: Normocephalic and atraumatic.  Nose: Nose normal.  Mouth/Throat: Oropharynx is clear and moist. No oropharyngeal exudate.  Right ear unremarkable.  Left external ear canal is red. Ear pain was reproducible when the ear was pulled out. TM is erythematous without bulging or perforation.  Eyes: EOM are normal. Pupils are equal, round, and reactive to light.  Neck: Normal range of motion. Neck supple.  Cardiovascular: Normal rate, regular rhythm and normal heart sounds.   Pulmonary/Chest: Effort normal and breath sounds normal.   Abdominal: Soft. Bowel sounds are normal. She exhibits no distension. There is no tenderness.  Lymphadenopathy:    She has no cervical adenopathy.  Neurological: She is alert and oriented to person, place, and time.  Skin: Skin is warm and dry.  Nursing note and vitals reviewed.   Urgent Care Course   Clinical Course     Procedures (including critical care time)  Labs Review Labs Reviewed  RAPID INFLUENZA A&B ANTIGENS [facility] ONLY)    Imaging Review No results found.  MDM   1. Other acute nonsuppurative otitis media of left ear, recurrence not specified   2. Acute otitis externa of left ear, unspecified type    Influenza testing is negative. Patient does have an ear infection today. Patient also endorses sinus pressure. We'll treat with Z-Pak for the ear infection and empirically for sinusitis along with Ciprodex. Patient declined the Ciprodex prescription stating that she has 3 bottles at home that have  not expired. Patient informed that she may use the Ciprodex that she has at home, to used for 4 drops in the left ear twice a day for 7 days.  Patient informed that she must follow-up at the [facility] student health services at the end of this week for re-evaluation.      Lucia Estelle, NP 12/18/16 1530

## 2016-12-26 ENCOUNTER — Ambulatory Visit: Payer: PRIVATE HEALTH INSURANCE | Admitting: Gastroenterology

## 2016-12-26 ENCOUNTER — Encounter: Payer: Self-pay | Admitting: Gastroenterology

## 2016-12-26 ENCOUNTER — Ambulatory Visit (INDEPENDENT_AMBULATORY_CARE_PROVIDER_SITE_OTHER): Payer: PRIVATE HEALTH INSURANCE | Admitting: Gastroenterology

## 2016-12-26 VITALS — BP 102/64 | HR 60 | Temp 98.8°F | Wt 126.0 lb

## 2016-12-26 DIAGNOSIS — K5090 Crohn's disease, unspecified, without complications: Secondary | ICD-10-CM

## 2016-12-26 DIAGNOSIS — K5000 Crohn's disease of small intestine without complications: Secondary | ICD-10-CM

## 2016-12-26 NOTE — Progress Notes (Signed)
Primary Care Physician: Penny Stain, MD  Primary Gastroenterologist:  Dr. Wyline Ho   Chief Complaint  Patient presents with  . Follow-up    4 week    HPI: Penny Ho is a 21 y.o. female She is here today for follow up. She was last seen on 11/28/16.   Summary of history : Crohns disease  Of the small and large bowel , iron deficiency , endometriosis.   Diagnosis and date: Dec 2014, diagnosed here , unsure area affected.  Prior surgeries: None  Present medication: was on Humira will stop . Presently on Entocort.   Last MRE: 11/17/2017long segment of diseased distal and termonal ileum similar to CT in 2015 .  Last EGD/Colonoscopy: Last colonoscopy 11/2013- ?ileal disease and sigmoid disease- commenced on pentasa and entocort Smoking status: never  Use of NSAID's No Last use of steroids: 11/2015 -prednisone for 2 weeks Chronic steroid use : no  Last DEXA scan : no  Hep B/A,HIV: immunization status: immune and negative HIV ab in 10/04/16.  Last Tdap, pneumonia and flu shot: Unsure about tdap but thinks she has had recently .  Vitamin D deficiency :  B12 deficiency : Not checked (previously ordered)  TPMT and TB statis:TB quantiferon 10/25/16- negative . Normal TPMT enzyme activity.  Celiac serology: negative  ADA antibody negative and adequate levels of adalimumab 10/26/16   Interval history 11/28/16-12/2016 See at the ER 12/18/16 for an ear infection -treated with Z pack . She never took the antibiotics. Denies any ear symptoms presently . She had flu like infection after new years. Still feeling ill.  She says her crohns is doing well. Still entocort - having 3-5 bowel movements  , non bloody .no abdominal pain.     Current Outpatient Prescriptions  Medication Sig Dispense Refill  . albuterol (PROVENTIL HFA;VENTOLIN HFA) 108 (90 Base) MCG/ACT inhaler Inhale 1-2 puffs into the lungs every 6 (six) hours as needed for wheezing or shortness of breath.    .  budesonide (ENTOCORT EC) 3 MG 24 hr capsule Take 3 capsules (9 mg total) by mouth daily. 183 capsule 0  . clobetasol ointment (TEMOVATE) 0.05 % Apply 1 application topically 2 (two) times daily. 30 g 0  . dicyclomine (BENTYL) 20 MG tablet Take 20 mg by mouth every 6 (six) hours.    Marland Kitchen gentamicin ointment (GARAMYCIN) 0.1 % APPLY TO PAINFUL EAR ONCE TO TWICE DAILY AS NEEDED FOR PAIN.  8  . olopatadine (PATANOL) 0.1 % ophthalmic solution APPLY 1 DROP INTO BOTH EYES TWICE DAILY AS NEEDED FOR ALLERGY EYES  3  . Probiotic Product (PROBIOTIC ADVANCED PO) Take by mouth.    . sertraline (ZOLOFT) 50 MG tablet Take 50 mg by mouth daily.     No current facility-administered medications for this visit.     Allergies as of 12/26/2016 - Review Complete 12/26/2016  Allergen Reaction Noted  . Other Other (See Comments) 12/01/2015  . 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 ENT: Negative for hoarseness, difficulty swallowing , nasal congestion. CV: Negative for chest pain, angina, palpitations, dyspnea on exertion, peripheral edema.  Respiratory: Negative for dyspnea at rest, dyspnea on exertion, cough, sputum, wheezing.  GI: See history of present illness. GU:  Negative for dysuria, hematuria, urinary incontinence, urinary frequency, nocturnal urination.  Endo: Negative for unusual weight change.    Physical Examination:   BP 102/64   Pulse 60   Temp  98.8 F (37.1 C)   Wt 126 lb (57.2 kg)   BMI 23.05 kg/m   General: Well-nourished, well-developed in no acute distress.  Eyes: No icterus. Conjunctivae pink. Mouth: Oropharyngeal mucosa moist and pink , no lesions erythema or exudate. Lungs: Clear to auscultation bilaterally. Non-labored. Heart: Regular rate and rhythm, no murmurs rubs or gallops.  Abdomen: Bowel sounds are normal, nontender, nondistended, no hepatosplenomegaly or masses, no abdominal  bruits or hernia , no rebound or guarding. Site of recent surgery scars well healed   Extremities: No lower extremity edema. No clubbing or deformities. Neuro: Alert and oriented x 3.  Grossly intact. Skin: Warm and dry, no jaundice.   Psych: Alert and cooperative, normal Ho and affect. Ear exam - otoscopy- no abnormality of ear drums, some cerumen in b/l canals, no pain on retraction of pinna or on mastoid process or on tragus  Imaging Studies: No results found.  Assessment and Plan:   Penny Ho is a 21 y.o. y/o female with a history of crohns ileo-colitis on Humira not responding. I will  her on Stelara which is a different class of medication compared to Humira which has stopped working for her. The stelara is an immunosuppressant and can increase risk of infections which has been discussed .d/c Humira . Continue oral iron. Follow up 2 weeks after infusion of Humira, will recheck labs at that time.She is also iron deficient and will start her on oral iron.   Dr Penny Mood  MD     Having some diarrhea. Some abdominal pain on and off . No NSAID's

## 2016-12-29 ENCOUNTER — Telehealth: Payer: Self-pay

## 2016-12-29 NOTE — Telephone Encounter (Signed)
Patient is waiting for a call back on when to start her medication and she has not received one yet. Please call patient and advice.

## 2017-01-02 NOTE — Telephone Encounter (Signed)
Patient is calling again. She needs a start date for an infusion with the cancer center for Stelara infusions.   She is panicking because she is moving in less than a week and hasn't heard anything from [facility] yet. Please call patient and afvice.

## 2017-01-02 NOTE — Telephone Encounter (Signed)
Advised pt I am in the process of authorizing her medication as her insurance company will only authorize this. I had sent this to [facility] specialty pharmacy but this had to be transferred out.

## 2017-01-02 NOTE — Telephone Encounter (Signed)
Patient has called and states that she needs a start date for an infusion with the cancer center for Stelara infusions.   She states that she may be moving on the 24th and would like to have this scheduled prior to her moving due to her graduating college.

## 2017-01-04 ENCOUNTER — Encounter: Payer: PRIVATE HEALTH INSURANCE | Admitting: Obstetrics and Gynecology

## 2017-01-09 ENCOUNTER — Telehealth: Payer: Self-pay

## 2017-01-09 ENCOUNTER — Telehealth: Payer: Self-pay | Admitting: Gastroenterology

## 2017-01-09 NOTE — Telephone Encounter (Signed)
Patient is calling to find out how to schedule her infusions. Please call patient and advice

## 2017-01-09 NOTE — Telephone Encounter (Signed)
When you call in a RX for Stelara (they need a verbal) call [facility] at [phone] per [facility]

## 2017-01-09 NOTE — Telephone Encounter (Signed)
Pt advised we are waiting on the medication to arrive and then we will set her up with the cancer center to receive the infusion.

## 2017-01-10 ENCOUNTER — Other Ambulatory Visit: Payer: Self-pay

## 2017-01-10 MED ORDER — USTEKINUMAB 130 MG/26ML IV SOLN: 390.0000 mg | Freq: Once | INTRAVENOUS | Status: DC

## 2017-01-11 ENCOUNTER — Other Ambulatory Visit: Payer: Self-pay

## 2017-01-11 MED ORDER — USTEKINUMAB 90 MG/ML ~~LOC~~ SOSY: PREFILLED_SYRINGE | SUBCUTANEOUS | 6 refills | Status: AC

## 2017-01-11 NOTE — Telephone Encounter (Signed)
Pt's rx has been faxed to [facility] for maintenance dose Stelara.

## 2017-01-15 ENCOUNTER — Telehealth: Payer: Self-pay | Admitting: Gastroenterology

## 2017-01-15 NOTE — Telephone Encounter (Signed)
Advised pt I do not handle the specialty medication precerts. Usually the specialty pharmacies do that. She will contact someone at her insurance company to get this information. Advised her I will help in anyway I can but would need telephone numbers and ext as this company is hard to reach.

## 2017-01-15 NOTE — Telephone Encounter (Signed)
Patient left a voice message that her Stelara needs a pre cert. Please call her

## 2017-01-16 ENCOUNTER — Other Ambulatory Visit: Payer: Self-pay | Admitting: Gastroenterology

## 2017-01-16 DIAGNOSIS — K5000 Crohn's disease of small intestine without complications: Secondary | ICD-10-CM

## 2017-01-16 DIAGNOSIS — R718 Other abnormality of red blood cells: Secondary | ICD-10-CM

## 2017-01-17 ENCOUNTER — Inpatient Hospital Stay: Payer: 59

## 2017-01-17 ENCOUNTER — Inpatient Hospital Stay: Payer: 59 | Attending: Hematology and Oncology | Admitting: Hematology and Oncology

## 2017-01-17 ENCOUNTER — Ambulatory Visit: Payer: 59

## 2017-01-17 ENCOUNTER — Encounter: Payer: Self-pay | Admitting: Hematology and Oncology

## 2017-01-17 VITALS — BP 103/69 | HR 132 | Resp 18

## 2017-01-17 VITALS — BP 101/69 | HR 140 | Temp 99.1°F | Resp 18 | Wt 127.9 lb

## 2017-01-17 DIAGNOSIS — Q796 Ehlers-Danlos syndrome: Secondary | ICD-10-CM | POA: Diagnosis not present

## 2017-01-17 DIAGNOSIS — J45909 Unspecified asthma, uncomplicated: Secondary | ICD-10-CM | POA: Diagnosis not present

## 2017-01-17 DIAGNOSIS — K5000 Crohn's disease of small intestine without complications: Secondary | ICD-10-CM

## 2017-01-17 DIAGNOSIS — K5090 Crohn's disease, unspecified, without complications: Secondary | ICD-10-CM | POA: Diagnosis not present

## 2017-01-17 DIAGNOSIS — Z79899 Other long term (current) drug therapy: Secondary | ICD-10-CM

## 2017-01-17 DIAGNOSIS — K219 Gastro-esophageal reflux disease without esophagitis: Secondary | ICD-10-CM | POA: Diagnosis not present

## 2017-01-17 DIAGNOSIS — F4310 Post-traumatic stress disorder, unspecified: Secondary | ICD-10-CM | POA: Insufficient documentation

## 2017-01-17 DIAGNOSIS — F329 Major depressive disorder, single episode, unspecified: Secondary | ICD-10-CM | POA: Insufficient documentation

## 2017-01-17 MED ORDER — SODIUM CHLORIDE 0.9 % IV SOLN
Freq: Once | INTRAVENOUS | Status: AC
  Administered 2017-01-17: 13:00:00 via INTRAVENOUS
  Filled 2017-01-17: qty 1000

## 2017-01-17 MED ORDER — USTEKINUMAB 130 MG/26ML IV SOLN
390.0000 mg | Freq: Once | INTRAVENOUS | Status: AC
  Administered 2017-01-17: 390 mg via INTRAVENOUS
  Filled 2017-01-17 (×3): qty 78

## 2017-01-17 MED ORDER — USTEKINUMAB 130 MG/26ML IV SOLN
390.0000 mg | Freq: Once | INTRAVENOUS | Status: AC
  Administered 2017-01-17: 390 mg via INTRAVENOUS
  Filled 2017-01-17: qty 78

## 2017-01-17 NOTE — Progress Notes (Signed)
Patient here today as new evaluation to start Stelara.  Referred by Dr. Tobi Bastos, GI.  Patient just finished college last week at [employer]..  Currently has no permanent address.  Patient is very anxious today.  States she has decreased BP and elevated HR all the time.  Today 101/69.  HR 140.  History of Crohns.

## 2017-01-17 NOTE — Progress Notes (Signed)
[facility]-  Cancer Center  Clinic day:  01/17/2017  Chief Complaint: Penny Ho is a 21 y.o. female with Crohn's disease who is referred by Dr. [name] for assessment prior to initiation of ustekinumab (Stelara).  HPI:  The patient was diagnosed with Crohn's disease in 11/2013.  Diagnosis was concurrent with her diagnosis of POTS (postural orthostatic tachycardia syndrome).  She states that she was weak and unable to go to class (college).  Colonoscopy in 11/2013 revealed ileal disease and sigmoid disease.  She was started on Pentasa (mesalamine) and Entocort (budesonide).  She was treated with Humira x 2 years.  Disease has progressed.  MR enterography of the abdomen and pelvis on 11/03/2016 revealed a long segment of Crohn's disease involving the distal and terminal ileum.  There was no abscess, fistula or obstruction.  There was no colonic involvement.  Labs on 10/26/2016 revealed a hematocrit of 39.5, hemoglobin 12.5, MCV 78, platelets 246,000, WBC 8100 with an ANC of 4000.  Creatinine was 0.62 with normal LFTs except an albumen of 3.4.  CRP was 12.8 (0-4.9).  Hepatitis A total antibody and hepatitis B surface antibody was consistent with immunity.  Celiac disease testing was negative.  HIV testing was negative on 11/14/2016.  TB quantiferon was negative on 10/25/2016.  She was last on steroids in 11/2015 for 2 weeks.  She had an ear infection on 12/18/2016.  She had flu-like symptoms.  Influenza A and B were negative.  Symptoms resolved.  She has no active infections.  Her diet consists of chicken and vegetables. She has iron deficiency.  Oral iron causes GI upset.  No prior history of transfusion or IV iron.    Symptomatically, she notes chronic abdominal discomfort.  She has 3-4 bowel movements (diarrhea) a day.  She denies any melena or hematochezia.  She is losing weight.   Past Medical History:  Diagnosis Date  . Absolute anemia 12/01/2015  . Airway  hyperreactivity 12/01/2015  . Anemia   . Anxiety   . Asthma   . Brain lesion 03/10/2013  . 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 Clinical depression 06/26/2013  . Crohn's disease (HCC)   . 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 Endometriosis   . Environmental allergies   . Exercise-induced asthma   . GERD (gastroesophageal reflux disease)   . H/o Lyme disease 09/16/2013  . History of biliary T-tube placement 09/17/2013  . POTS (postural orthostatic tachycardia syndrome)   . PTSD (post-traumatic stress disorder)   . Vaso vagal episode    POTS-followed by [facility]  . Vomiting and diarrhea     Past Surgical History:  Procedure Laterality Date  . BILIARY TUBE PLACEMENT ([facility] HX)  09/17/2013  . INTRAUTERINE DEVICE (IUD) INSERTION N/A 11/20/2016   Procedure: INTRAUTERINE DEVICE (IUD) INSERTION;  Surgeon: Herold Harms, MD;  Location: [facility] ORS;  Service: Gynecology;  Laterality: N/A;  . INTRAUTERINE DEVICE INSERTION    . IUD REMOVAL N/A 11/20/2016   Procedure: INTRAUTERINE DEVICE (IUD) REMOVAL;  Surgeon: Herold Harms, MD;  Location: [facility] ORS;  Service: Gynecology;  Laterality: N/A;  . LAPAROSCOPY N/A 11/20/2016   Procedure: LAPAROSCOPY DIAGNOSTIC WITH BIOPSIES;  Surgeon: Herold Harms, MD;  Location: [facility] ORS;  Service: Gynecology;  Laterality: N/A;  . Fausto Skillern Ples Specter BIOPSY  11/20/2016   Procedure: VULVAR BIOPSY;  Surgeon: Herold Harms, MD;  Location: [facility] ORS;  Service: Gynecology;;  .  WISDOM TOOTH EXTRACTION      Family History  Problem Relation Age of Onset  . Healthy Mother   . Healthy Father   . Cancer Paternal Grandmother   . Cancer Paternal Grandfather   . Colon cancer Neg Hx   . Crohn's disease Neg Hx     Social History:  reports that she has never smoked. She has never used smokeless tobacco. She reports that she uses drugs, including Marijuana. She reports that she does not  drink alcohol.  She is originally from [place].  She came to [place] to attend [employer].  She recently graduated from [employer] in film and Doctor, hospital.  She is job Architect.  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  The patient is alone today.  Her boyfriend is in the waiting room.  Allergies:  Allergies  Allergen Reactions  . Other Other (See Comments)    PT. STATES SHE HAS BEEN ADVISED TO AVOID ANTIBIOTICS DUE TO C. DIFF CAUSED BY ANTIBIOTIC USE  . Pistachio Nut (Diagnostic) Anaphylaxis    TREE NUT ALLERGY (almonds, [place] nuts, cashews, chestnuts, filberts/hazelnuts, macadamia nuts, pecans, pistachios, pine nuts, shea nuts,walnuts)    Current Medications: Current Outpatient Prescriptions  Medication Sig Dispense Refill  . albuterol (PROVENTIL HFA;VENTOLIN HFA) 108 (90 Base) MCG/ACT inhaler Inhale 1-2 puffs into the lungs every 6 (six) hours as needed for wheezing or shortness of breath.    . clobetasol ointment (TEMOVATE) 0.05 % Apply 1 application topically 2 (two) times daily. 30 g 0  . dicyclomine (BENTYL) 20 MG tablet Take 20 mg by mouth every 6 (six) hours.    Marland Kitchen gentamicin ointment (GARAMYCIN) 0.1 % APPLY TO PAINFUL EAR ONCE TO TWICE DAILY AS NEEDED FOR PAIN.  8  . olopatadine (PATANOL) 0.1 % ophthalmic solution APPLY 1 DROP INTO BOTH EYES TWICE DAILY AS NEEDED FOR ALLERGY EYES  3  . Probiotic Product (PROBIOTIC ADVANCED PO) Take by mouth.    . sertraline (ZOLOFT) 50 MG tablet Take 50 mg by mouth daily.    . ustekinumab (STELARA) 90 MG/ML SOSY injection Inject 90mg  SubQ 8 weeks after IV induction dose then every 8 weeks after 1 Syringe 6   No current facility-administered medications for this visit.     Review of Systems:  GENERAL:  Does not feel sick.  Low grade fever for months.  No sweats.  Weight loss. PERFORMANCE STATUS (ECOG): 1 HEENT:  No visual changes, runny nose, sore throat, mouth sores or tenderness. Lungs: No shortness of breath or cough.  No hemoptysis.   Distant history of exercise induced asthma. Cardiac:  No chest pain, palpitations, orthopnea, or PND. GI:  No nausea, vomiting, diarrhea, constipation, melena or hematochezia. GU:  No urgency, frequency, dysuria, or hematuria.  Endometriosis.  No menses since 2014 secondary to IUD. Musculoskeletal:  No back pain.  No joint pain.  No muscle tenderness. Extremities:  No pain or swelling. Skin:  No rashes or skin changes. Neuro:  Headache.  No numbness or weakness, balance or coordination issues. Endocrine:  No diabetes, thyroid issues, hot flashes or night sweats. Psych:  No mood changes, depression or anxiety. Pain:  No focal pain. Review of systems:  All other systems reviewed and found to be negative.  Physical Exam: Blood pressure 101/69, pulse (!) 140, temperature 99.1 F (37.3 C), temperature source Tympanic, resp. rate 18, weight 127 lb 13.9 oz (58 kg). GENERAL:  Well developed, well nourished, woman sitting comfortably in the exam room in no acute  distress. MENTAL STATUS:  Alert and oriented to person, place and time. HEAD:  Red hair.  Normocephalic, atraumatic, face symmetric, no Cushingoid features. EYES:  Blue eyes.  Pupils equal round and reactive to light and accomodation.  No conjunctivitis or scleral icterus. ENT:  Oropharynx clear without lesion.  Tongue normal. Mucous membranes moist.  RESPIRATORY:  Clear to auscultation without rales, wheezes or rhonchi. CARDIOVASCULAR:  Regular rate and rhythm without murmur, rub or gallop. ABDOMEN:  Soft, slightly tender on deep palpation (chronic) without guarding or rebound tenderness.  Active bowel sounds and no hepatosplenomegaly.  No masses. SKIN:  Pale.  No rashes, ulcers or lesions. EXTREMITIES: No edema, no skin discoloration or tenderness.  No palpable cords. LYMPH NODES: No palpable cervical, supraclavicular, axillary or inguinal adenopathy  NEUROLOGICAL: Unremarkable. PSYCH:  Appropriate.   No visits with results within 3  Day(s) from this visit.  Latest known visit with results is:  Admission on 12/18/2016, Discharged on 12/18/2016  Component Date Value Ref Range Status  . Influenza A ([facility]) 12/18/2016 NEGATIVE  NEGATIVE Final  . Influenza B ([facility]) 12/18/2016 NEGATIVE  NEGATIVE Final    Assessment:  Penny Ho is a 21 y.o. female with Crohn's disease diagnosed in 11/2013.  Colonoscopy in 11/2013 revealed ileal disease and sigmoid disease.  She was started on Pentasa (mesalamine) and Entocort (budesonide).  She was treated with Humira (adalimumab) x 2 years.  Disease has progressed.  MR enterography of the abdomen and pelvis on 11/03/2016 revealed a long segment of Crohn's disease involving the distal and terminal ileum.  There was no abscess, fistula or obstruction.  There was no colonic involvement.  Labs on 10/26/2016 revealed immunity to hepatitis A and B.   Celiac disease testing was negative.  HIV testing was negative on 11/14/2016.  TB quantiferon was negative on 10/25/2016.  She was last on steroids in 11/2015 for 2 weeks.  She has POTS (postural orthostatic tachycardia syndrome).  She has iron deficiency.  Oral iron causes GI upset.  No prior history of transfusion or IV iron.    Symptomatically, she notes chronic abdominal discomfort.  She has 3-4 bowel movements (diarrhea) a day.  She denies any melena or hematochezia.  She is losing weight.  Plan: 1.  Discuss medical history, diagnosis and treatment to date for Crohn's disease.  Discuss treatment plan per Dr. Tobi Bastos consisting of ustekinumab Marcy Panning).  Side effects reviewed in detail.  Potential side effects discussed included infusion reaction/anaphylaxis, GI symptoms, genitourinary symptoms, skin cancer, arthralgias, reversible posterior leukoencephalopathy syndrome (rare), and tuberculosis.  Patient had negative TB quantiferon testing.  Information was provided to the patient.  2.  Discuss plan for IV Stelara today followed by maintenance SQ  regimen.  Anticipate SQ maintenance regimen beginning 8 weeks after IV dose.  Patient stated that she was aware of the plan and plans to self administer drug under Dr Johnney Killian supervision.  3.  Stelara 390 mg IV today. 4.  Follow-up with Dr. Tobi Bastos. 5.  RTC prn.   Rosey Bath, MD  01/17/2017, 3:00 PM

## 2017-01-17 NOTE — Patient Instructions (Signed)
Patient educated to call GI doctor for any side effects or problems related to infusion of Stelara.  Info packet given to patient.

## 2017-01-22 NOTE — Telephone Encounter (Signed)
1. Stool for cdiff and pcr 2. Agree with entocord 9 mg a day for 4 weeks

## 2017-01-22 NOTE — Telephone Encounter (Signed)
Pt just had first Stelara IV infusion last Wednesday. She said her crohn's is flaring up. Is there something you can prescribe for her? Her next SQ injection will not be for another 7 weeks. Do you want to see her in office first? Please advise.

## 2017-01-23 ENCOUNTER — Encounter: Payer: Self-pay | Admitting: Emergency Medicine

## 2017-01-23 ENCOUNTER — Telehealth: Payer: Self-pay | Admitting: Gastroenterology

## 2017-01-23 ENCOUNTER — Emergency Department: Payer: 59

## 2017-01-23 ENCOUNTER — Emergency Department
Admission: EM | Admit: 2017-01-23 | Discharge: 2017-01-23 | Disposition: A | Discharge status: Home / Self Care | Payer: 59 | Attending: Emergency Medicine | Admitting: Emergency Medicine

## 2017-01-23 DIAGNOSIS — K50819 Crohn's disease of both small and large intestine with unspecified complications: Secondary | ICD-10-CM | POA: Diagnosis not present

## 2017-01-23 DIAGNOSIS — E860 Dehydration: Secondary | ICD-10-CM | POA: Diagnosis not present

## 2017-01-23 DIAGNOSIS — R112 Nausea with vomiting, unspecified: Secondary | ICD-10-CM | POA: Diagnosis not present

## 2017-01-23 DIAGNOSIS — R109 Unspecified abdominal pain: Secondary | ICD-10-CM | POA: Diagnosis present

## 2017-01-23 DIAGNOSIS — R197 Diarrhea, unspecified: Secondary | ICD-10-CM | POA: Diagnosis not present

## 2017-01-23 DIAGNOSIS — R1084 Generalized abdominal pain: Secondary | ICD-10-CM | POA: Diagnosis not present

## 2017-01-23 DIAGNOSIS — Z79899 Other long term (current) drug therapy: Secondary | ICD-10-CM | POA: Diagnosis not present

## 2017-01-23 LAB — LIPASE, BLOOD: Lipase: 47 U/L (ref 11–51)

## 2017-01-23 LAB — COMPREHENSIVE METABOLIC PANEL
ALK PHOS: 49 U/L (ref 38–126)
ALT: 10 U/L — ABNORMAL LOW (ref 14–54)
AST: 13 U/L — AB (ref 15–41)
Albumin: 2.6 g/dL — ABNORMAL LOW (ref 3.5–5.0)
Anion gap: 8 (ref 5–15)
BILIRUBIN TOTAL: 0.4 mg/dL (ref 0.3–1.2)
BUN: 7 mg/dL (ref 6–20)
CALCIUM: 8.6 mg/dL — AB (ref 8.9–10.3)
CO2: 24 mmol/L (ref 22–32)
CREATININE: 0.63 mg/dL (ref 0.44–1.00)
Chloride: 103 mmol/L (ref 101–111)
GFR calc Af Amer: >60 mL/min (ref >60)
GLUCOSE: 96 mg/dL (ref 65–99)
POTASSIUM: 3.6 mmol/L (ref 3.5–5.1)
Sodium: 135 mmol/L (ref 135–145)
TOTAL PROTEIN: 6.5 g/dL (ref 6.5–8.1)

## 2017-01-23 LAB — GASTROINTESTINAL PANEL BY PCR, STOOL (REPLACES STOOL CULTURE)
ADENOVIRUS F40/41: NOT DETECTED
ASTROVIRUS: NOT DETECTED
Campylobacter species: NOT DETECTED
Cryptosporidium: NOT DETECTED
Cyclospora cayetanensis: NOT DETECTED
ENTEROAGGREGATIVE E COLI (EAEC): NOT DETECTED
ENTEROPATHOGENIC E COLI (EPEC): NOT DETECTED
ENTEROTOXIGENIC E COLI (ETEC): NOT DETECTED
Entamoeba histolytica: NOT DETECTED
GIARDIA LAMBLIA: NOT DETECTED
NOROVIRUS GI/GII: NOT DETECTED
Plesimonas shigelloides: NOT DETECTED
Rotavirus A: NOT DETECTED
SAPOVIRUS (I, II, IV, AND V): NOT DETECTED
SHIGA LIKE TOXIN PRODUCING E COLI (STEC): NOT DETECTED
Salmonella species: NOT DETECTED
Shigella/Enteroinvasive E coli (EIEC): NOT DETECTED
Vibrio cholerae: NOT DETECTED
Vibrio species: NOT DETECTED
Yersinia enterocolitica: NOT DETECTED

## 2017-01-23 LAB — C-REACTIVE PROTEIN: CRP: 4.6 mg/dL — AB (ref <1.0)

## 2017-01-23 LAB — URINALYSIS, COMPLETE (UACMP) WITH MICROSCOPIC
BACTERIA UA: NONE SEEN
BILIRUBIN URINE: NEGATIVE
GLUCOSE, UA: NEGATIVE mg/dL
HGB URINE DIPSTICK: NEGATIVE
Ketones, ur: NEGATIVE mg/dL
LEUKOCYTES UA: NEGATIVE
NITRITE: NEGATIVE
PH: 7 (ref 5.0–8.0)
Protein, ur: NEGATIVE mg/dL
RBC / HPF: NONE SEEN RBC/hpf (ref 0–5)
SPECIFIC GRAVITY, URINE: 1.005 (ref 1.005–1.030)
Squamous Epithelial / LPF: NONE SEEN

## 2017-01-23 LAB — CBC
HEMATOCRIT: 36.3 % (ref 35.0–47.0)
Hemoglobin: 11.7 g/dL — ABNORMAL LOW (ref 12.0–16.0)
MCH: 23.2 pg — ABNORMAL LOW (ref 26.0–34.0)
MCHC: 32.2 g/dL (ref 32.0–36.0)
MCV: 72.2 fL — ABNORMAL LOW (ref 80.0–100.0)
PLATELETS: 320 10*3/uL (ref 150–440)
RBC: 5.04 MIL/uL (ref 3.80–5.20)
RDW: 16.7 % — AB (ref 11.5–14.5)
WBC: 8.5 10*3/uL (ref 3.6–11.0)

## 2017-01-23 LAB — C DIFFICILE QUICK SCREEN W PCR REFLEX
C DIFFICILE (CDIFF) INTERP: NOT DETECTED
C DIFFICILE (CDIFF) TOXIN: NEGATIVE
C Diff antigen: NEGATIVE

## 2017-01-23 LAB — PREGNANCY, URINE: PREG TEST UR: NEGATIVE

## 2017-01-23 LAB — POCT PREGNANCY, URINE: Preg Test, Ur: NEGATIVE

## 2017-01-23 LAB — LACTOFERRIN, FECAL, QUALITATIVE: Lactoferrin, Fecal, Qual: POSITIVE — AB

## 2017-01-23 MED ORDER — ONDANSETRON HCL 4 MG PO TABS: 4.0000 mg | ORAL_TABLET | Freq: Every day | ORAL | 0 refills | Status: AC | PRN

## 2017-01-23 MED ORDER — IOPAMIDOL (ISOVUE-300) INJECTION 61%: 30.0000 mL | Freq: Once | INTRAVENOUS | Status: DC

## 2017-01-23 MED ORDER — MORPHINE SULFATE (PF) 2 MG/ML IV SOLN
2.0000 mg | Freq: Once | INTRAVENOUS | Status: AC
  Administered 2017-01-23: 2 mg via INTRAVENOUS
  Filled 2017-01-23: qty 1

## 2017-01-23 MED ORDER — IOPAMIDOL (ISOVUE-300) INJECTION 61%
75.0000 mL | Freq: Once | INTRAVENOUS | Status: AC | PRN
  Administered 2017-01-23: 75 mL via INTRAVENOUS

## 2017-01-23 MED ORDER — ONDANSETRON HCL 4 MG/2ML IJ SOLN
4.0000 mg | Freq: Once | INTRAMUSCULAR | Status: AC
  Administered 2017-01-23: 4 mg via INTRAVENOUS
  Filled 2017-01-23: qty 2

## 2017-01-23 MED ORDER — ONDANSETRON 4 MG PO TBDP: 4.0000 mg | ORAL_TABLET | Freq: Once | ORAL | Status: DC | PRN

## 2017-01-23 MED ORDER — IOPAMIDOL (ISOVUE-300) INJECTION 61%
15.0000 mL | INTRAVENOUS | Status: DC
  Administered 2017-01-23: 30 mL via ORAL

## 2017-01-23 MED ORDER — SODIUM CHLORIDE 0.9 % IV BOLUS (SEPSIS)
1000.0000 mL | Freq: Once | INTRAVENOUS | Status: AC
  Administered 2017-01-23: 1000 mL via INTRAVENOUS

## 2017-01-23 NOTE — ED Provider Notes (Signed)
[facility] Emergency Department Provider Note  ____________________________________________   First MD Initiated Contact with Patient 01/23/17 1522     (approximate)  I have reviewed the triage vital signs and the nursing notes.   HISTORY  Chief Complaint Abdominal Pain   HPI Penny Ho is a 21 y.o. female with a history of Crohn's diseaseon Stelara was presented to the emergency department with 5 days of nausea vomiting and diarrhea. She says that over the past 2 days she has been vomiting up to 3 times a day has been having about 5 episodes of stool just in the mornings. She says that her stools have been dark but without any obvious blood. No blood in her vomitus. She says that her abdominal pain is aching and diffuse especially to the right upper and lower quadrants. There is no report of fever.   Past Medical History:  Diagnosis Date  . Absolute anemia 12/01/2015  . Airway hyperreactivity 12/01/2015  . Anemia   . Anxiety   . Asthma   . Brain lesion 03/10/2013  . Clinical depression 06/26/2013  . Crohn's disease (HCC)   . 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 Endometriosis   . Environmental allergies   . Exercise-induced asthma   . GERD (gastroesophageal reflux disease)   . History of biliary T-tube placement 09/17/2013  . POTS (postural orthostatic tachycardia syndrome)   . PTSD (post-traumatic stress disorder)   . Vaso vagal episode    POTS-followed by [facility]  . Vomiting and diarrhea     Patient Active Problem List   Diagnosis Date Noted  . 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 Endometriosis determined by laparoscopy 11/29/2016  . Crohn's disease involving terminal ileum (HCC) 10/25/2016  . CD (Crohn's disease) (HCC) 12/01/2015  . Airway hyperreactivity 12/01/2015  . Absolute anemia 12/01/2015  . Crohn's disease (HCC) 06/16/2015  . Bowel disease, inflammatory  09/05/2014  . Disease of immune system (HCC) 06/21/2014  . Ehlers-Danlos syndrome 04/17/2014  . Postural orthostatic tachycardia syndrome 11/28/2013  . D (diarrhea) 11/28/2013  . Episode of syncope 09/17/2013  . Sinus tachycardia 09/17/2013  . Status post laparoscopy 09/17/2013  . 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 H/o Lyme disease 09/16/2013  . Seizure disorder (HCC) 09/16/2013  . Clinical depression 06/26/2013  . Alteration consciousness 06/26/2013  . Brain lesion 03/10/2013    Past Surgical History:  Procedure Laterality Date  . BILIARY TUBE PLACEMENT ([facility] HX)  09/17/2013  . INTRAUTERINE DEVICE (IUD) INSERTION N/A 11/20/2016   Procedure: INTRAUTERINE DEVICE (IUD) INSERTION;  Surgeon: Herold Harms, MD;  Location: [facility] ORS;  Service: Gynecology;  Laterality: N/A;  . INTRAUTERINE DEVICE INSERTION    . IUD REMOVAL N/A 11/20/2016   Procedure: INTRAUTERINE DEVICE (IUD) REMOVAL;  Surgeon: Herold Harms, MD;  Location: [facility] ORS;  Service: Gynecology;  Laterality: N/A;  . LAPAROSCOPY N/A 11/20/2016   Procedure: LAPAROSCOPY DIAGNOSTIC WITH BIOPSIES;  Surgeon: Herold Harms, MD;  Location: [facility] ORS;  Service: Gynecology;  Laterality: N/A;  . VULVA Ples Specter BIOPSY  11/20/2016   Procedure: VULVAR BIOPSY;  Surgeon: Herold Harms, MD;  Location: [facility] ORS;  Service: Gynecology;;  . Leone Haven TOOTH EXTRACTION      Prior to Admission medications   Medication Sig Start Date End Date Taking? Authorizing Provider  albuterol (PROVENTIL HFA;VENTOLIN HFA) 108 (90 Base) MCG/ACT inhaler Inhale 1-2 puffs into the lungs every 6 (six) hours as  needed for wheezing or shortness of breath.   Yes Historical Provider, MD  budesonide (ENTOCORT EC) 3 MG 24 hr capsule TAKE 3 CAPSULES (9 MG TOTAL) BY MOUTH DAILY. 01/22/17 03/24/17 Yes [name], MD  clobetasol ointment (TEMOVATE) 0.05 % Apply 1 application topically 2 (two) times daily. 11/29/16  Yes Prentice Docker Defrancesco, MD  dicyclomine (BENTYL)  20 MG tablet Take 20 mg by mouth every 6 (six) hours.   Yes Historical Provider, MD  gentamicin ointment (GARAMYCIN) 0.1 % APPLY TO PAINFUL EAR ONCE TO TWICE DAILY AS NEEDED FOR PAIN. 10/06/16  Yes Historical Provider, MD  olopatadine (PATANOL) 0.1 % ophthalmic solution APPLY 1 DROP INTO BOTH EYES TWICE DAILY AS NEEDED FOR ALLERGY EYES 09/21/16  Yes Historical Provider, MD  sertraline (ZOLOFT) 50 MG tablet Take 50 mg by mouth daily.   Yes Historical Provider, MD  ustekinumab Marcy Panning) 90 MG/ML SOSY injection Inject 90mg  SubQ 8 weeks after IV induction dose then every 8 weeks after 01/11/17  Yes [name], MD    Allergies Other and Pistachio nut (diagnostic)  Family History  Problem Relation Age of Onset  . Healthy Mother   . Healthy Father   . Cancer Paternal Grandmother   . Cancer Paternal Grandfather   . Colon cancer Neg Hx   . Crohn's disease Neg Hx     Social History Social History  Substance Use Topics  . Smoking status: Never Smoker  . Smokeless tobacco: Never Used  . Alcohol use No     Comment: occasional    Review of Systems Constitutional: No fever/chills Eyes: No visual changes. ENT: No sore throat. Cardiovascular: Denies chest pain. Respiratory: Denies shortness of breath. Gastrointestinal:   No constipation. Genitourinary: Negative for dysuria. Musculoskeletal: Negative for back pain. Skin: Negative for rash. Neurological: Negative for headaches, focal weakness or numbness.  10-point ROS otherwise negative.  ____________________________________________   PHYSICAL EXAM:  VITAL SIGNS: ED Triage Vitals  Enc Vitals Group     BP 01/23/17 1502 120/74     Pulse Rate 01/23/17 1502 (!) 119     Resp 01/23/17 1502 18     Temp 01/23/17 1502 98.5 F (36.9 C)     Temp Source 01/23/17 1502 Oral     SpO2 01/23/17 1502 100 %     Weight 01/23/17 1501 130 lb (59 kg)     Height 01/23/17 1501 5\' 2"  (1.575 m)     Head Circumference --      Peak Flow --      Pain  Score 01/23/17 1502 4     Pain Loc --      Pain Edu? --      Excl. in GC? --     Constitutional: Alert and oriented. Well appearing and in no acute distress. Eyes: Conjunctivae are normal. PERRL. EOMI. Head: Atraumatic. Nose: No congestion/rhinnorhea. Mouth/Throat: Mucous membranes are moist.   Neck: No stridor.   Cardiovascular: Normal rate, regular rhythm. Grossly normal heart sounds.   Respiratory: Normal respiratory effort.  No retractions. Lungs CTAB. Gastrointestinal: Soft with mild-to-moderate diffuse tenderness palpation especially the right lower and right upper quadrant with air is moderate tenderness to palpation without any rebound or guarding. No distention.  Musculoskeletal: No lower extremity tenderness nor edema.  No joint effusions. Neurologic:  Normal speech and language. No Pursel focal neurologic deficits are appreciated.  Skin:  Skin is warm, dry and intact. No rash noted. Psychiatric: Mood and affect are normal. Speech and behavior are normal.  ____________________________________________  LABS (all labs ordered are listed, but only abnormal results are displayed)  Labs Reviewed  COMPREHENSIVE METABOLIC PANEL - Abnormal; Notable for the following:       Result Value   Calcium 8.6 (*)    Albumin 2.6 (*)    AST 13 (*)    ALT 10 (*)    All other components within normal limits  CBC - Abnormal; Notable for the following:    Hemoglobin 11.7 (*)    MCV 72.2 (*)    MCH 23.2 (*)    RDW 16.7 (*)    All other components within normal limits  URINALYSIS, COMPLETE (UACMP) WITH MICROSCOPIC - Abnormal; Notable for the following:    Color, Urine STRAW (*)    APPearance CLEAR (*)    All other components within normal limits  LACTOFERRIN, FECAL,QUALITATIVE - Abnormal; Notable for the following:    Lactoferrin, Fecal, Qual POSITIVE (*)    All other components within normal limits  C DIFFICILE QUICK SCREEN W PCR REFLEX  GASTROINTESTINAL PANEL BY PCR, STOOL  (REPLACES STOOL CULTURE)  LIPASE, BLOOD  PREGNANCY, URINE  C-REACTIVE PROTEIN  POCT PREGNANCY, URINE   ____________________________________________  EKG   ____________________________________________  RADIOLOGY  CT Abdomen Pelvis W Contrast (Final result)  Result time 01/23/17 18:52:10  Final result by Charlett Nose, MD (01/23/17 18:52:10)           Narrative:   CLINICAL DATA: Generalized abdominal pain, vomiting. History of Crohn's disease.  EXAM: CT ABDOMEN AND PELVIS WITH CONTRAST  TECHNIQUE: Multidetector CT imaging of the abdomen and pelvis was performed using the standard protocol following bolus administration of intravenous contrast.  CONTRAST: 75mL ISOVUE-300 IOPAMIDOL (ISOVUE-300) INJECTION 61%  COMPARISON: MRI 11/03/2016  FINDINGS: Lower chest: Lung bases are clear. No effusions. Heart is normal size.  Hepatobiliary: No focal hepatic abnormality. Gallbladder unremarkable.  Pancreas: No focal abnormality or ductal dilatation.  Spleen: No focal abnormality. Normal size.  Adrenals/Urinary Tract: No adrenal abnormality. No focal renal abnormality. No stones or hydronephrosis. Urinary bladder is unremarkable.  Stomach/Bowel: There is a long segment of abnormal distal small bowel with wall thickening compatible with Crohn's disease. This is similar to prior MRI. No is of bowel obstruction. Stomach and large bowel unremarkable.  Vascular/Lymphatic: No evidence of aneurysm or adenopathy.  Reproductive: IUD noted in the uterus. No adnexal masses.  Other: Small amount of free fluid in the pelvis. No free air.  Musculoskeletal: No acute bony abnormality.  IMPRESSION: Long segment of small bowel with wall thickening involving the distal and terminal ileum compatible with Crohn's disease. Findings are similar to prior MRI.  Small amount of free fluid in the pelvis.  No visualized fistula, abscess, or perforation.   Electronically  Signed By: Charlett Nose M.D. On: 01/23/2017 18:52            ____________________________________________   PROCEDURES  Procedure(s) performed:   Procedures  Critical Care performed:   ____________________________________________   INITIAL IMPRESSION / ASSESSMENT AND PLAN / ED COURSE  Pertinent labs & imaging results that were available during my care of the patient were reviewed by me and considered in my medical decision making (see chart for details).  ----------------------------------------- 9:32 PM on 01/23/2017 ----------------------------------------- Patient resting comfortably at this time. Was able to tolerate the by mouth contrast as well as food without further vomiting. Very reassuring workup including an unchanged CAT scan. I discussed the lab results as well as the CAT scan with Dr. Tobi Bastos, patient's GI specialist, who recommended that  the patient follow-up as an outpatient and be discharged with Zofran. I discussed this with the patient as well as the lab results and imaging. She is understanding and willing to comply. Possible viral cause. Patient counseled to return for any worsening or concerning symptoms       ____________________________________________   FINAL CLINICAL IMPRESSION(S) / ED DIAGNOSES  abdominal pain with nausea vomiting and diarrhea.    NEW MEDICATIONS STARTED DURING THIS VISIT:  New Prescriptions   No medications on file     Note:  This document was prepared using Dragon voice recognition software and may include unintentional dictation errors.    Myrna Blazer, MD 01/23/17 2133

## 2017-01-23 NOTE — ED Notes (Signed)
Urine preg test: NEGATIVE

## 2017-01-23 NOTE — ED Triage Notes (Signed)
Pt also reports has had fever for last 2 months.

## 2017-01-23 NOTE — ED Notes (Signed)
Pt to CT

## 2017-01-23 NOTE — Telephone Encounter (Signed)
Patient left a voice message that she is in constant pain and getting worse every day. Diarrhea and fever. Please call today

## 2017-01-23 NOTE — ED Triage Notes (Signed)
C/o generalized abdominal pain from vomiting. Has had NVD for 1 week. Has hx crohns but this feels worse than normal flare.  Dr Tobi Bastos will meet pt in ED (aware here, will be down 30 min).  Has had some black stool.

## 2017-01-23 NOTE — Consult Note (Signed)
[name] MD  [address] [place], [place] [address] Phone: [phone] Fax : [phone]  Consultation  Referring Provider:     Emergency room Primary Care Physician:  Noralee Stain, MD Primary Gastroenterologist:  Dr. Tobi Bastos         Reason for Consultation:     Crohns disease   Date of Admission:  01/23/2017 Date of Consultation:  01/23/2017         HPI:   Penny Ho is a 22 y.o. female here today at the ER . She called my office 2 hours back saying she was really unwell and was directed to the Er.    Summary of history : Crohns disease  Of the small and large bowel , iron deficiency , endometriosis.   Diagnosis and date: Dec 2014, diagnosed here , unsure area affected.  Prior surgeries: None  Present medication: . Presently on Entocort 9 mg a day for a few weeks and received first dose of Stelara on 01/17/17 after she failed Humira  Last MRE: 11/17/2017long segment of diseased distal and terminal ileum similar to CT in 2015 .  Last EGD/Colonoscopy: Last colonoscopy 11/2013- ?ileal disease and sigmoid disease- commenced on pentasa and entocort Use of NSAID's No Last use of steroids: 11/2015 -prednisone for 2 weeks Chronic steroid use : no   She says that for the past 5 days has been unable to keep any food down and has had multiple episodes of vomiting , feels very thirst, tired and weak. Says she has had fever, chills and rigors. Denies any dysuria, cough . Last two days she says has been particularly worse. Some nausea. Multiple watery bowel movements a day , joint pains , feel her heart is racing.    Past Medical History:  Diagnosis Date  . Absolute anemia 12/01/2015  . Airway hyperreactivity 12/01/2015  . Anemia   . Anxiety   . Asthma   . Brain lesion 03/10/2013  . Clinical depression 06/26/2013  . Crohn's disease (HCC)   . 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 Endometriosis   .  Environmental allergies   . Exercise-induced asthma   . GERD (gastroesophageal reflux disease)   . History of biliary T-tube placement 09/17/2013  . POTS (postural orthostatic tachycardia syndrome)   . PTSD (post-traumatic stress disorder)   . Vaso vagal episode    POTS-followed by [facility]  . Vomiting and diarrhea     Past Surgical History:  Procedure Laterality Date  . BILIARY TUBE PLACEMENT ([facility] HX)  09/17/2013  . INTRAUTERINE DEVICE (IUD) INSERTION N/A 11/20/2016   Procedure: INTRAUTERINE DEVICE (IUD) INSERTION;  Surgeon: Herold Harms, MD;  Location: [facility] ORS;  Service: Gynecology;  Laterality: N/A;  . INTRAUTERINE DEVICE INSERTION    . IUD REMOVAL N/A 11/20/2016   Procedure: INTRAUTERINE DEVICE (IUD) REMOVAL;  Surgeon: Herold Harms, MD;  Location: [facility] ORS;  Service: Gynecology;  Laterality: N/A;  . LAPAROSCOPY N/A 11/20/2016   Procedure: LAPAROSCOPY DIAGNOSTIC WITH BIOPSIES;  Surgeon: Herold Harms, MD;  Location: [facility] ORS;  Service: Gynecology;  Laterality: N/A;  . VULVA Ples Specter BIOPSY  11/20/2016   Procedure: VULVAR BIOPSY;  Surgeon: Herold Harms, MD;  Location: [facility] ORS;  Service: Gynecology;;  . Leone Haven TOOTH EXTRACTION      Prior to Admission medications   Medication Sig Start Date End Date Taking? Authorizing Provider  albuterol (PROVENTIL HFA;VENTOLIN HFA) 108 (90 Base) MCG/ACT  inhaler Inhale 1-2 puffs into the lungs every 6 (six) hours as needed for wheezing or shortness of breath.    Historical Provider, MD  budesonide (ENTOCORT EC) 3 MG 24 hr capsule TAKE 3 CAPSULES (9 MG TOTAL) BY MOUTH DAILY. 01/22/17 03/24/17  [name], MD  clobetasol ointment (TEMOVATE) 0.05 % Apply 1 application topically 2 (two) times daily. 11/29/16   Prentice Docker Defrancesco, MD  dicyclomine (BENTYL) 20 MG tablet Take 20 mg by mouth every 6 (six) hours.    Historical Provider, MD  gentamicin ointment (GARAMYCIN) 0.1 % APPLY TO PAINFUL EAR ONCE TO TWICE DAILY AS NEEDED  FOR PAIN. 10/06/16   Historical Provider, MD  olopatadine (PATANOL) 0.1 % ophthalmic solution APPLY 1 DROP INTO BOTH EYES TWICE DAILY AS NEEDED FOR ALLERGY EYES 09/21/16   Historical Provider, MD  Probiotic Product (PROBIOTIC ADVANCED PO) Take by mouth.    Historical Provider, MD  sertraline (ZOLOFT) 50 MG tablet Take 50 mg by mouth daily.    Historical Provider, MD  ustekinumab Marcy Panning) 90 MG/ML SOSY injection Inject 90mg  SubQ 8 weeks after IV induction dose then every 8 weeks after 01/11/17   [name], MD    Family History  Problem Relation Age of Onset  . Healthy Mother   . Healthy Father   . Cancer Paternal Grandmother   . Cancer Paternal Grandfather   . Colon cancer Neg Hx   . Crohn's disease Neg Hx      Social History  Substance Use Topics  . Smoking status: Never Smoker  . Smokeless tobacco: Never Used  . Alcohol use No     Comment: occasional    Allergies as of 01/23/2017 - Review Complete 01/23/2017  Allergen Reaction Noted  . Other Other (See Comments) 12/01/2015  . Pistachio nut (diagnostic) Anaphylaxis 11/06/2016    Review of Systems:    All systems reviewed and negative except where noted in HPI.   Physical Exam:  Vital signs in last 24 hours: Temp:  [98.5 F (36.9 C)] 98.5 F (36.9 C) (02/06 1502) Pulse Rate:  [119] 119 (02/06 1502) Resp:  [18] 18 (02/06 1502) BP: (120)/(74) 120/74 (02/06 1502) SpO2:  [100 %] 100 % (02/06 1502) Weight:  [130 lb (59 kg)] 130 lb (59 kg) (02/06 1501)   General:   Pleasant, cooperative in NAD, mucus membranes moist Head:  Normocephalic and atraumatic. Eyes:   No icterus.   Conjunctiva pink. PERRLA. Ears:  Normal auditory acuity. Neck:  Supple; no masses or thyroidomegaly Lungs: Respirations even and unlabored. Lungs clear to auscultation bilaterally.   No wheezes, crackles, or rhonchi.  Heart:  Regular rate and rhythm;  Without murmur, clicks, rubs or gallops Abdomen:  Soft, nondistended, mild rt lower quadrant  tenderness . Normal bowel sounds. No appreciable masses or hepatomegaly.  No rebound or guarding.  Rectal:  Not performed. Msk:  Symmetrical without Battisti deformities.   Extremities:  Without edema, cyanosis or clubbing. Neurologic:  Alert and oriented x3;  grossly normal neurologically. Skin:  Intact without significant lesions or rashes. Cervical Nodes:  No significant cervical adenopathy. Psych:  Alert and cooperative. Normal affect.  LAB RESULTS:  Recent Labs  01/23/17 1503  WBC 8.5  HGB 11.7*  HCT 36.3  PLT 320   BMET No results for input(s): NA, K, CL, CO2, GLUCOSE, BUN, CREATININE, CALCIUM in the last 72 hours. LFT No results for input(s): PROT, ALBUMIN, AST, ALT, ALKPHOS, BILITOT, BILIDIR, IBILI in the last 72 hours. PT/INR No results for  input(s): LABPROT, INR in the last 72 hours.  STUDIES: No results found.    Impression / Plan:   Oreatha Fabry is a 21 y.o. y/o female with history of crohns ileo-colitis . Failed Humira and commenced on biologic agent Stelara. She received her first dose on 01/17/17. She has been on entocort 9 mg for a few weeks. Feeling unwell last 5 days, not able to keep any food down ,fevers, diarrhea.  She is immunocompromised and hence at higher risk for infections. She may have a GI bug which is causing her to get dehydrated. We will need to screen for infection , r/o bowel obstruction from crohns disease with CT abdomen.   Plan  1. CBC,BMP,CRP 2. Stool for c diff , PCR 3. CT abdomen and pelvis 4. IV fluids 5. Antiemetics 6. Pregnancy test, urine cultures and blood cultures if febrile.   7 . If CT scan shows no worrisome features and is able to keep food and liquid down then can plan to d.c tomorrow.   I am not in the hospital tomorrow but feel free to call me on [phone] to discuss about her.   Thank you for involving me in the care of this patient.      LOS: 0 days   [name], MD  01/23/2017, 3:35 PM

## 2017-01-23 NOTE — ED Notes (Signed)
Pt uprite on stretcher in exam room with no distress noted, watching TV; SO at bedside; pt reports feeling much better with no nausea and decreased mid abd pain 3/10; reports that she ate several crackers without difficulty and would like the doctor to know that she is doing better; Dr Pershing Proud notified

## 2017-01-24 NOTE — Telephone Encounter (Signed)
Pt was advised by Dr. Tobi Bastos to go to the ER.

## 2017-01-27 ENCOUNTER — Other Ambulatory Visit: Payer: Self-pay | Admitting: Gastroenterology

## 2017-02-13 ENCOUNTER — Other Ambulatory Visit
Admission: RE | Admit: 2017-02-13 | Discharge: 2017-02-13 | Disposition: A | Discharge status: Home / Self Care | Payer: 59 | Source: Ambulatory Visit | Attending: Gastroenterology | Admitting: Gastroenterology

## 2017-02-13 ENCOUNTER — Encounter: Payer: Self-pay | Admitting: Gastroenterology

## 2017-02-13 ENCOUNTER — Ambulatory Visit (INDEPENDENT_AMBULATORY_CARE_PROVIDER_SITE_OTHER): Payer: 59 | Admitting: Gastroenterology

## 2017-02-13 VITALS — BP 104/66 | HR 107 | Ht 62.0 in | Wt 123.0 lb

## 2017-02-13 DIAGNOSIS — K219 Gastro-esophageal reflux disease without esophagitis: Secondary | ICD-10-CM

## 2017-02-13 DIAGNOSIS — K50019 Crohn's disease of small intestine with unspecified complications: Secondary | ICD-10-CM | POA: Diagnosis present

## 2017-02-13 LAB — CBC WITH DIFFERENTIAL/PLATELET
BASOS ABS: 0.1 10*3/uL (ref 0–0.1)
BASOS PCT: 1 %
EOS ABS: 0.1 10*3/uL (ref 0–0.7)
Eosinophils Relative: 2 %
HEMATOCRIT: 34.9 % — AB (ref 35.0–47.0)
HEMOGLOBIN: 11.3 g/dL — AB (ref 12.0–16.0)
Lymphocytes Relative: 28 %
Lymphs Abs: 1.6 10*3/uL (ref 1.0–3.6)
MCH: 23.6 pg — ABNORMAL LOW (ref 26.0–34.0)
MCHC: 32.3 g/dL (ref 32.0–36.0)
MCV: 73 fL — ABNORMAL LOW (ref 80.0–100.0)
Monocytes Absolute: 0.8 10*3/uL (ref 0.2–0.9)
Monocytes Relative: 13 %
NEUTROS ABS: 3.3 10*3/uL (ref 1.4–6.5)
NEUTROS PCT: 56 %
Platelets: 314 10*3/uL (ref 150–440)
RBC: 4.79 MIL/uL (ref 3.80–5.20)
RDW: 18.6 % — ABNORMAL HIGH (ref 11.5–14.5)
WBC: 5.8 10*3/uL (ref 3.6–11.0)

## 2017-02-13 LAB — HEPATIC FUNCTION PANEL
ALBUMIN: 2.4 g/dL — AB (ref 3.5–5.0)
ALK PHOS: 54 U/L (ref 38–126)
ALT: 11 U/L — ABNORMAL LOW (ref 14–54)
AST: 15 U/L (ref 15–41)
BILIRUBIN TOTAL: 0.4 mg/dL (ref 0.3–1.2)
Bilirubin, Direct: <0.1 mg/dL — ABNORMAL LOW (ref 0.1–0.5)
TOTAL PROTEIN: 6.2 g/dL — AB (ref 6.5–8.1)

## 2017-02-13 MED ORDER — AZATHIOPRINE 50 MG PO TABS: 50.0000 mg | ORAL_TABLET | Freq: Every day | ORAL | 0 refills | Status: AC

## 2017-02-13 MED ORDER — OMEPRAZOLE 40 MG PO CPDR: 40.0000 mg | DELAYED_RELEASE_CAPSULE | Freq: Every day | ORAL | 3 refills | Status: AC

## 2017-02-13 NOTE — Progress Notes (Signed)
Primary Care Physician: Pcp Not In System  Primary Gastroenterologist:  Dr. [name]   No chief complaint on file.   HPI: Penny Ho is a 21 y.o. female.  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   Diagnosis and date: Dec 2014, diagnosed here , unsure area affected.  Prior surgeries: None  Present medication: . Presently on Entocort 9 mg a day for a few weeks and received first dose of Stelara on 01/17/17 after she failed Humira   Last MRE: 11/17/2017long segment of diseased distal and terminal ileum similar to CT in 2015 . Last EGD/Colonoscopy: Last colonoscopy 11/2013- ?ileal disease and sigmoid disease- commenced on pentasa and entocort Use of NSAID's No Last use of steroids: 11/2015 -prednisone for 2 weeks Chronic steroid use : no   Interval history 12/2016-01/2017  She had to go to the Er for vomiting , fatigue. Ct scan of the abdomen showed inflammation of the distal ileum similar to prior MRI. Stool was negative for C diff and PCR. Urine analysis was negative.   Hb 11.7 , MCV 72, albumin low at 2.6  After the ER visit , she was feeling better. She returned to her parents to [place] for a few weeks, " things got better" , less diarrhea, pain . She returned to [facility] , says developed a lot of acid reflux, abdominal pain, epigastric and lower abdominal pain. Over belly button , worse with food intake and when hungry. No NSAID's. Has diarrhea 7-8 times a day .watery and sloppy,  She does have gas and bloating.  Some fevers at night . Lower abdominal cramping. Still on budesonide 3 tablets a day . Joint pains are getting worse. Next dose of Stelara is in march 28th . Feeling tired. Was on iron tablets and caused severe pain and had to stop . Tried bentyl in the past which did not help. Not been on a PPI.   Current Outpatient Prescriptions  Medication Sig Dispense Refill  . albuterol (PROVENTIL HFA;VENTOLIN HFA)  108 (90 Base) MCG/ACT inhaler Inhale 1-2 puffs into the lungs every 6 (six) hours as needed for wheezing or shortness of breath.    . azaTHIOprine (IMURAN) 50 MG tablet Take 1 tablet (50 mg total) by mouth daily. 30 tablet 0  . budesonide (ENTOCORT EC) 3 MG 24 hr capsule TAKE 3 CAPSULES (9 MG TOTAL) BY MOUTH DAILY. 183 capsule 0  . clobetasol ointment (TEMOVATE) 0.05 % Apply 1 application topically 2 (two) times daily. 30 g 0  . dicyclomine (BENTYL) 20 MG tablet Take 20 mg by mouth every 6 (six) hours.    Marland Kitchen gentamicin ointment (GARAMYCIN) 0.1 % APPLY TO PAINFUL EAR ONCE TO TWICE DAILY AS NEEDED FOR PAIN.  8  . olopatadine (PATANOL) 0.1 % ophthalmic solution APPLY 1 DROP INTO BOTH EYES TWICE DAILY AS NEEDED FOR ALLERGY EYES  3  . omeprazole (PRILOSEC) 40 MG capsule Take 1 capsule (40 mg total) by mouth daily. 90 capsule 3  . ondansetron (ZOFRAN) 4 MG tablet Take 1 tablet (4 mg total) by mouth daily as needed. 10 tablet 0  . sertraline (ZOLOFT) 50 MG tablet Take 50 mg by mouth daily.    . ustekinumab (STELARA) 90 MG/ML SOSY injection Inject 90mg  SubQ 8 weeks after IV induction dose then every 8 weeks after 1 Syringe 6   No current facility-administered medications for this visit.     Allergies as of 02/13/2017 - Review  Complete 01/23/2017  Allergen Reaction Noted  . Other Other (See Comments) 12/01/2015  . 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 ENT: Negative for hoarseness, difficulty swallowing , nasal congestion. CV: Negative for chest pain, angina, palpitations, dyspnea on exertion, peripheral edema.  Respiratory: Negative for dyspnea at rest, dyspnea on exertion, cough, sputum, wheezing.  GI: See history of present illness. GU:  Negative for dysuria, hematuria, urinary incontinence, urinary frequency, nocturnal urination.  Endo: Negative for unusual weight change.    Physical  Examination:   There were no vitals taken for this visit.  General: Well-nourished, well-developed in no acute distress.  Eyes: No icterus. Conjunctivae pink. Mouth: Oropharyngeal mucosa moist and pink , no lesions erythema or exudate. Lungs: Clear to auscultation bilaterally. Non-labored. Heart: Regular rate and rhythm, no murmurs rubs or gallops.  Abdomen: Bowel sounds are normal, nontender, nondistended, no hepatosplenomegaly or masses, no abdominal bruits or hernia , no rebound or guarding.   Extremities: No lower extremity edema. No clubbing or deformities. Neuro: Alert and oriented x 3.  Grossly intact. Skin: Warm and dry, no jaundice.   Psych: Alert and cooperative, normal mood and affect.   Imaging Studies: Ct Abdomen Pelvis W Contrast  Result Date: 01/23/2017 CLINICAL DATA:  Generalized abdominal pain, vomiting. History of Crohn's disease. EXAM: CT ABDOMEN AND PELVIS WITH CONTRAST TECHNIQUE: Multidetector CT imaging of the abdomen and pelvis was performed using the standard protocol following bolus administration of intravenous contrast. CONTRAST:  12mL ISOVUE-300 IOPAMIDOL (ISOVUE-300) INJECTION 61% COMPARISON:  MRI 11/03/2016 FINDINGS: Lower chest: Lung bases are clear. No effusions. Heart is normal size. Hepatobiliary: No focal hepatic abnormality. Gallbladder unremarkable. Pancreas: No focal abnormality or ductal dilatation. Spleen: No focal abnormality.  Normal size. Adrenals/Urinary Tract: No adrenal abnormality. No focal renal abnormality. No stones or hydronephrosis. Urinary bladder is unremarkable. Stomach/Bowel: There is a long segment of abnormal distal small bowel with wall thickening compatible with Crohn's disease. This is similar to prior MRI. No is of bowel obstruction. Stomach and large bowel unremarkable. Vascular/Lymphatic: No evidence of aneurysm or adenopathy. Reproductive: IUD noted in the uterus.  No adnexal masses. Other: Small amount of free fluid in the pelvis.   No free air. Musculoskeletal: No acute bony abnormality. IMPRESSION: Long segment of small bowel with wall thickening involving the distal and terminal ileum compatible with Crohn's disease. Findings are similar to prior MRI. Small amount of free fluid in the pelvis. No visualized fistula, abscess, or perforation. Electronically Signed   By: Charlett Nose M.D.   On: 01/23/2017 18:52    Assessment and Plan:   Penny Ho is a 21 y.o. y/o female with history of crohns ileo-colitis . Failed Humira and commenced on biologic agent Stelara. She received her first dose on 01/17/17. She has been on entocort 9 mg for a few weeks.She has normal TPMT enzyme activity .she is immune to hep A/B. Presently she is a bit better but still has moderate symptoms. I am hoping the Stelara starts to help her and will also commence her on Imuran to prevent antibody formation and will likely keep her on it for one year. She may be moving to [place]. If she does have advised to her to set up with a new GI asap as she is on imuran and needs regular lab work. She will need a DEXA scan in the future.   Plan   1. CBC,BMP,LFT,CRP 2. IV iron with  Dr Parks Neptune as she is not able to tolerate PO iron  3. Discussed risks vs benefits of commencing on Imuran including but not limited to risks of immunosupression , non melanomatous skin cancer, bone marrow suppression .  4. Omeprazole daily 5. Weekly CBC,LFT for 3 weeks 6. F/u in 4 weeks   Dr [name]  MD

## 2017-02-19 ENCOUNTER — Encounter: Payer: Self-pay | Admitting: Gastroenterology

## 2017-02-21 ENCOUNTER — Telehealth: Payer: Self-pay | Admitting: Gastroenterology

## 2017-02-21 ENCOUNTER — Encounter: Payer: Self-pay | Admitting: Gastroenterology

## 2017-02-21 NOTE — Telephone Encounter (Signed)
Patient left a voice message that she is waiting on lab results and needs orders for a new one. She has moved back up [place] and wants to know what to do. Please call [phone]

## 2017-02-22 ENCOUNTER — Other Ambulatory Visit: Payer: Self-pay

## 2017-02-22 NOTE — Telephone Encounter (Signed)
1 week after starting imuran needs CBC,BMP,LFT,CRP  2 week after starting Imuran needs same repeated  Further testing based on these labs

## 2017-02-27 ENCOUNTER — Telehealth: Payer: Self-pay | Admitting: Gastroenterology

## 2017-02-27 NOTE — Telephone Encounter (Signed)
Specialty pharmacy information was given to pt. Released available labs and images. Pt has moved back home. No longer living in [place].

## 2017-02-27 NOTE — Telephone Encounter (Signed)
Patient left a voice message. She needs to know what is the specialty pharmacy will she be using? Please call

## 2017-03-12 ENCOUNTER — Telehealth: Payer: Self-pay | Admitting: Gastroenterology

## 2017-03-12 NOTE — Telephone Encounter (Signed)
Spoke to patient and mother.  Insurance: [id number] Group #: [id number]  [facility] states injectable claim has been processed and paid on 03/12/17. Medication available for patient.

## 2017-03-12 NOTE — Telephone Encounter (Signed)
Also called insurance company and this is not an active account.

## 2017-03-12 NOTE — Telephone Encounter (Signed)
Stelara 90 mg needs a prior auth.

## 2017-03-12 NOTE — Telephone Encounter (Signed)
Lmom for father to call us with correct insurance information.

## 2017-03-12 NOTE — Telephone Encounter (Signed)
Lmom for patient to call our office. Insurance is ineligible. Has been verified through [employer] under [employer] and [employer].

## 2017-03-19 ENCOUNTER — Telehealth: Payer: Self-pay | Admitting: Gastroenterology

## 2017-03-19 NOTE — Telephone Encounter (Signed)
Patient LVM returning a phone call

## 2017-07-01 IMAGING — CT CT ABD-PELV W/ CM
2 of 4 series · 16 of 46 positions shown, 18 images · IV contrast (APPLIED)
Comparison: MRI 11/03/2016

CLINICAL DATA: Generalized abdominal pain, vomiting. History of
Crohn's disease.

EXAM:
CT ABDOMEN AND PELVIS WITH CONTRAST
TECHNIQUE: Multidetector CT imaging of the abdomen and pelvis was performed
using the standard protocol following bolus administration of
intravenous contrast.
CONTRAST:  75mL [id number] IOPAMIDOL ([id number]) INJECTION 61%

[Series 2: routine abd/pel with · axial · 0.71mm/px · z∈[-458,-78]mm · 13 of 84 slices shown, 15 images]
[im 4/84  soft-tissue]
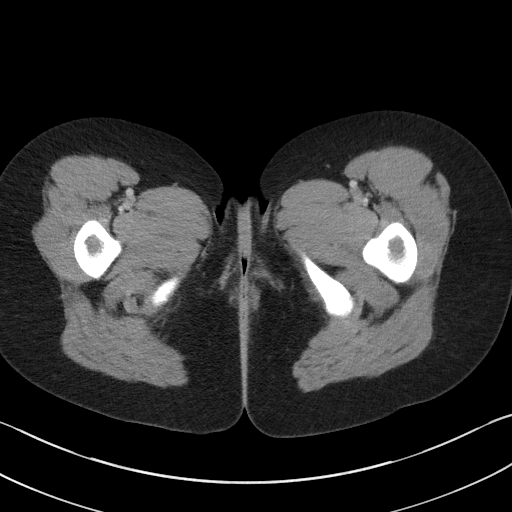
[im 4/84  bone]
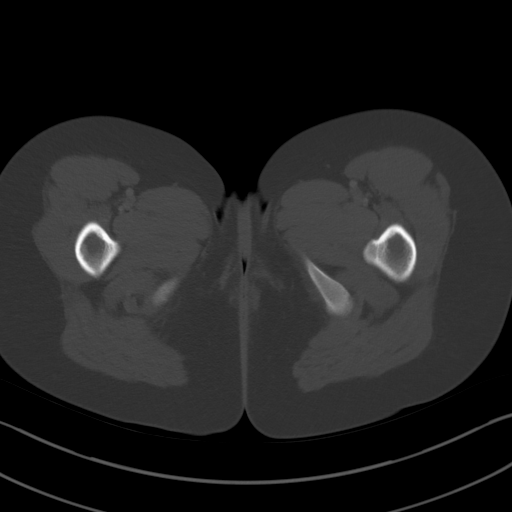
[im 10/84  soft-tissue]
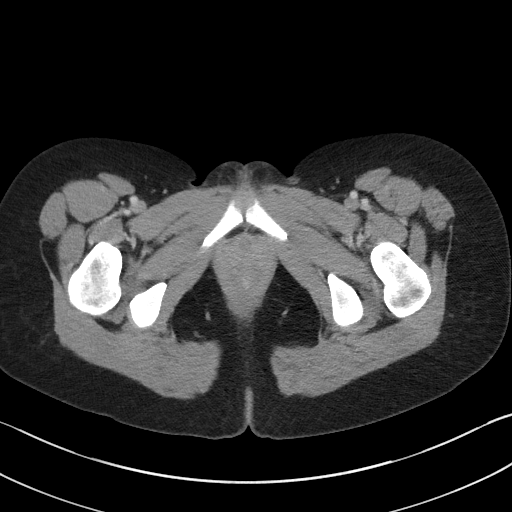
[im 16/84  soft-tissue]
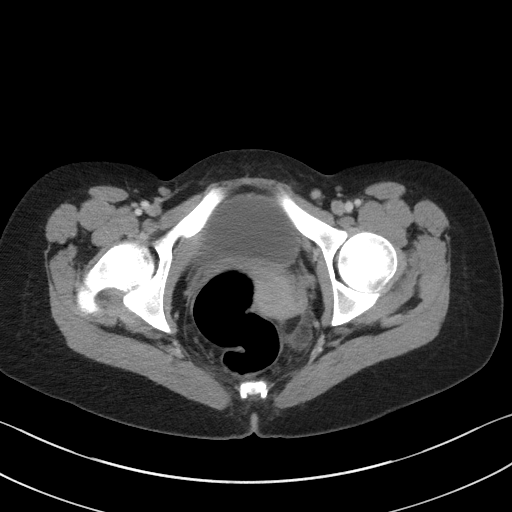
[im 23/84  soft-tissue]
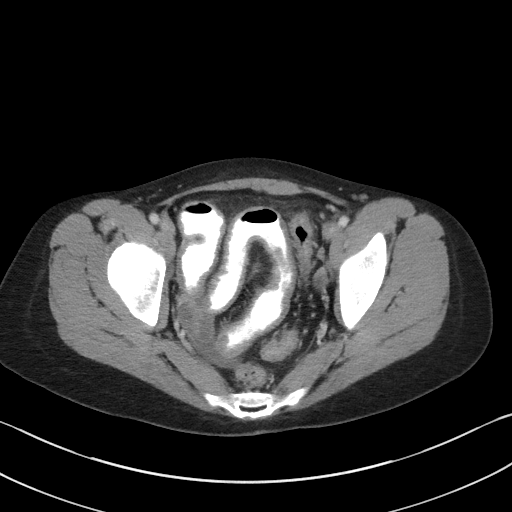
[im 29/84  soft-tissue]
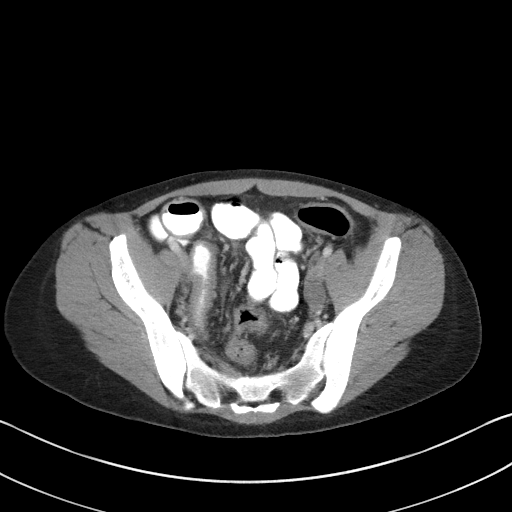
[im 36/84  soft-tissue]
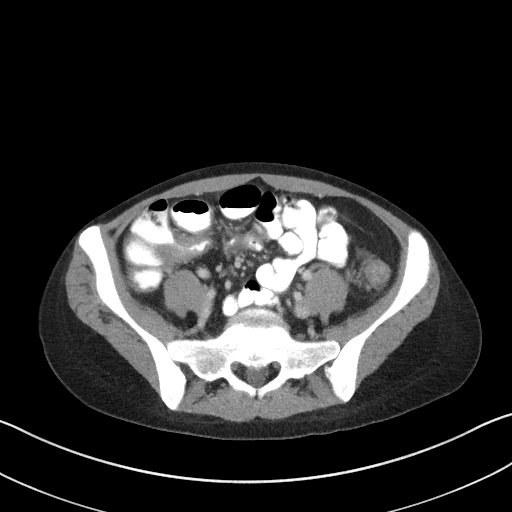
[im 42/84  soft-tissue]
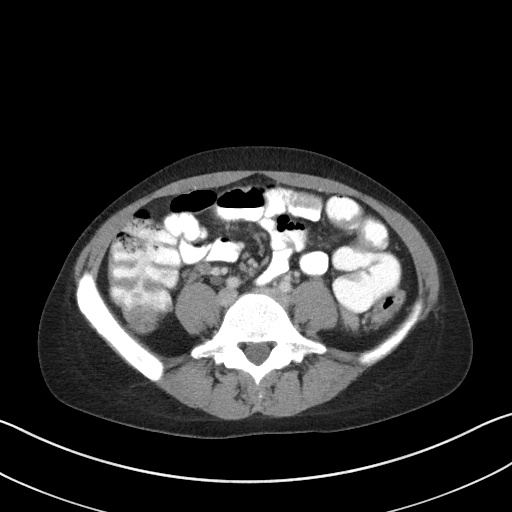
[im 48/84  soft-tissue]
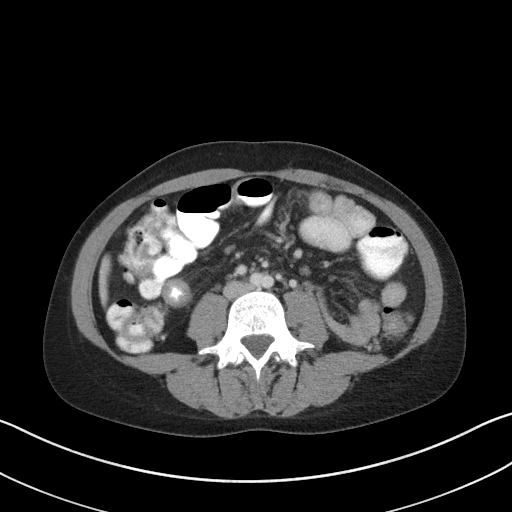
[im 55/84  soft-tissue]
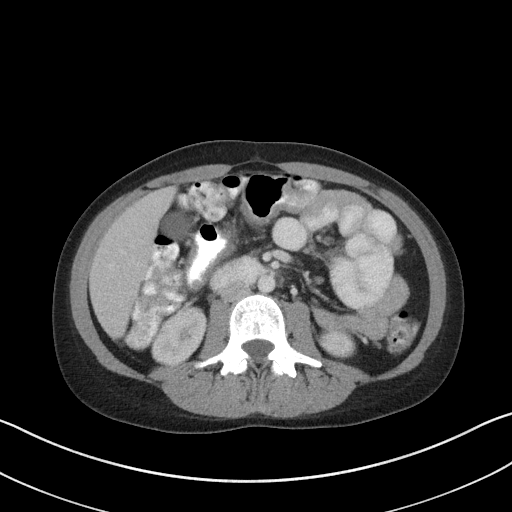
[im 55/84  bone]
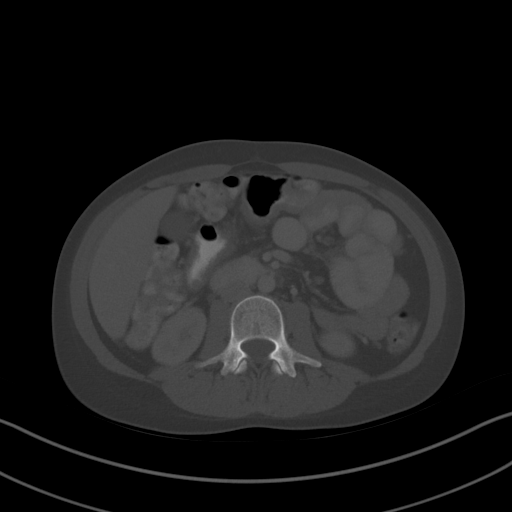
[im 61/84  soft-tissue]
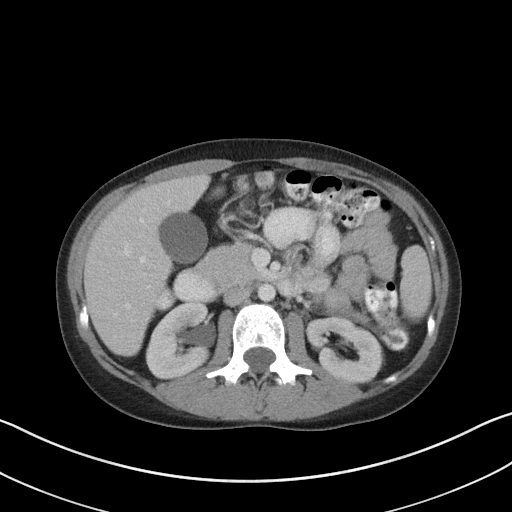
[im 68/84  soft-tissue]
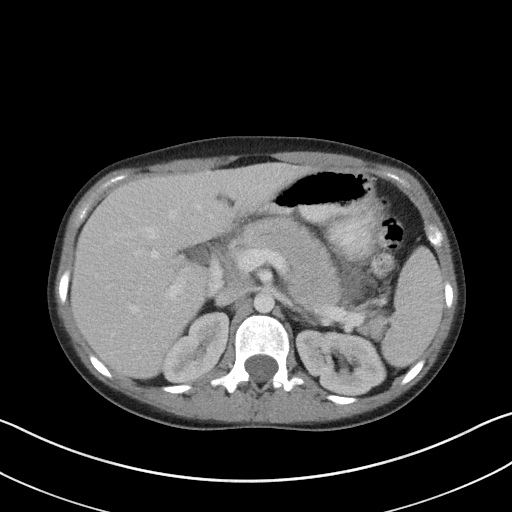
[im 74/84  soft-tissue]
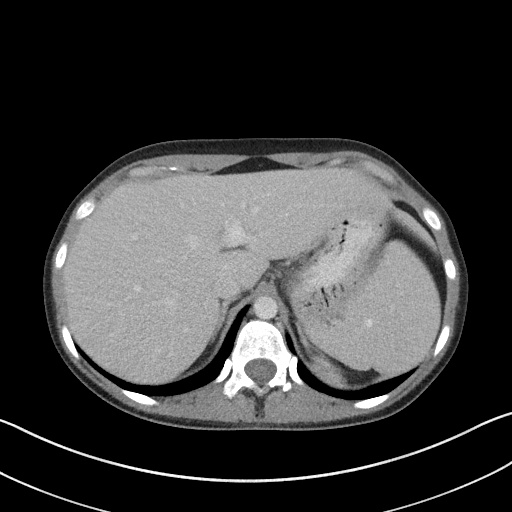
[im 80/84  soft-tissue]
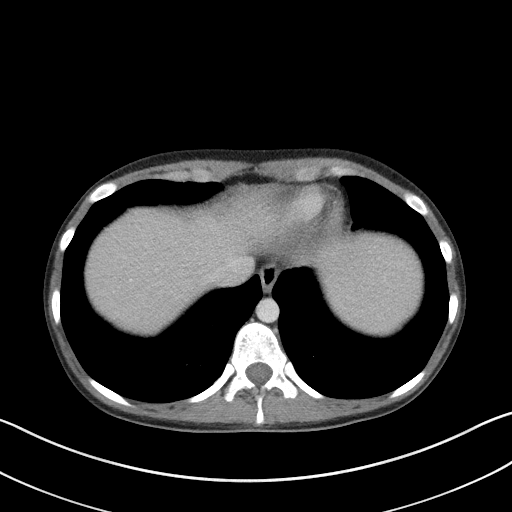

[Series 5: coronal st · coronal · 0.65mm/px · 3 of 70 slices shown]
[im 24/70  soft-tissue]
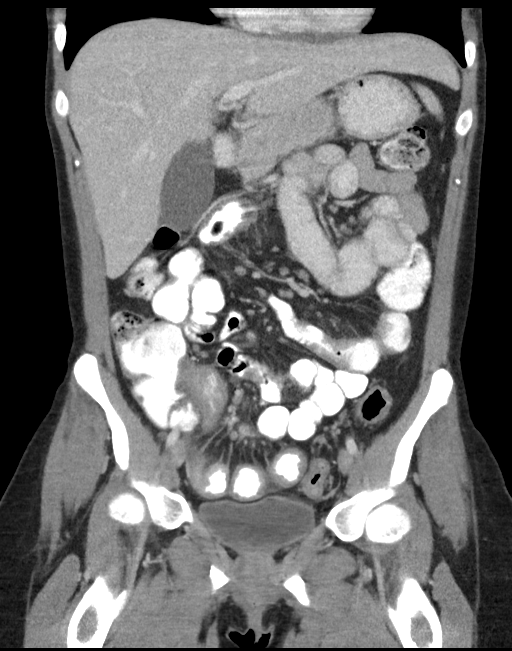
[im 31/70  soft-tissue]
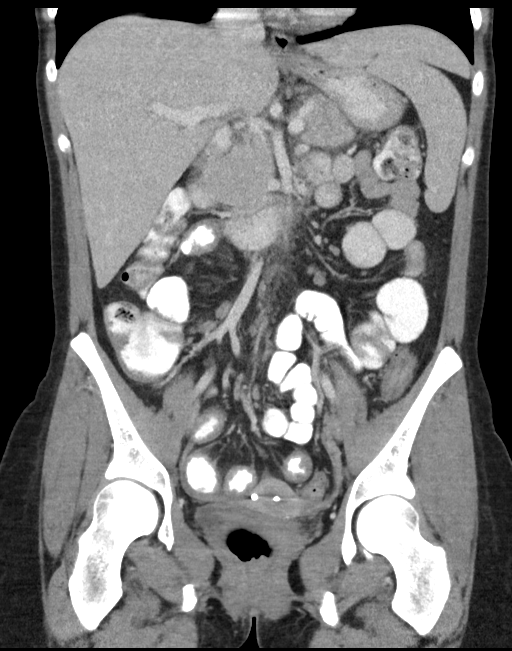
[im 39/70  soft-tissue]
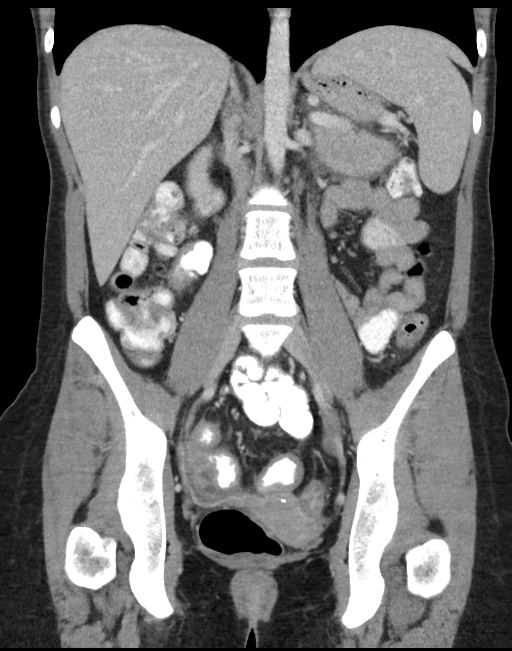

[16 of 46 positions shown; findings below may reference images not displayed]

FINDINGS: Lower chest: Lung bases are clear. No effusions. Heart is normal
size.

Hepatobiliary: No focal hepatic abnormality. Gallbladder
unremarkable.

Pancreas: No focal abnormality or ductal dilatation.

Spleen: No focal abnormality.  Normal size.

Adrenals/Urinary Tract: No adrenal abnormality. No focal renal
abnormality. No stones or hydronephrosis. Urinary bladder is
unremarkable.

Stomach/Bowel: There is a long segment of abnormal distal small
bowel with wall thickening compatible with Crohn's disease. This is
similar to prior MRI. No is of bowel obstruction. Stomach and large
bowel unremarkable.

Vascular/Lymphatic: No evidence of aneurysm or adenopathy.

Reproductive: IUD noted in the uterus.  No adnexal masses.

Other: Small amount of free fluid in the pelvis.  No free air.

Musculoskeletal: No acute bony abnormality.
IMPRESSION: Long segment of small bowel with wall thickening involving the
distal and terminal ileum compatible with Crohn's disease. Findings
are similar to prior MRI.

Small amount of free fluid in the pelvis.

No visualized fistula, abscess, or perforation.
